# Patient Record
Sex: Male | Born: 1965 | Race: White | Hispanic: No | Smoking: Current every day smoker
Health system: Southern US, Community
[De-identification: ages and names within clinical notes are randomized; demographics above are authoritative.]

## PROBLEM LIST (undated history)

## (undated) DIAGNOSIS — K219 Gastro-esophageal reflux disease without esophagitis: Secondary | ICD-10-CM

## (undated) DIAGNOSIS — G4700 Insomnia, unspecified: Secondary | ICD-10-CM

## (undated) DIAGNOSIS — G8929 Other chronic pain: Secondary | ICD-10-CM

## (undated) DIAGNOSIS — F32A Depression, unspecified: Secondary | ICD-10-CM

## (undated) DIAGNOSIS — G5601 Carpal tunnel syndrome, right upper limb: Secondary | ICD-10-CM

## (undated) DIAGNOSIS — F329 Major depressive disorder, single episode, unspecified: Secondary | ICD-10-CM

## (undated) DIAGNOSIS — K5792 Diverticulitis of intestine, part unspecified, without perforation or abscess without bleeding: Secondary | ICD-10-CM

## (undated) DIAGNOSIS — G5602 Carpal tunnel syndrome, left upper limb: Secondary | ICD-10-CM

## (undated) DIAGNOSIS — R0683 Snoring: Secondary | ICD-10-CM

## (undated) HISTORY — PX: KNEE ARTHROSCOPY: SUR90

## (undated) HISTORY — PX: COLONOSCOPY: SHX174

## (undated) HISTORY — PX: CERVICAL FUSION: SHX112

---

## 1990-10-07 HISTORY — PX: HERNIA REPAIR: SHX51

## 1999-02-12 ENCOUNTER — Other Ambulatory Visit: Admission: RE | Admit: 1999-02-12 | Discharge: 1999-02-12 | Payer: Self-pay | Admitting: Family Medicine

## 2003-10-08 HISTORY — PX: PARTIAL COLECTOMY: SHX5273

## 2004-10-09 ENCOUNTER — Ambulatory Visit: Payer: Self-pay | Admitting: Family Medicine

## 2004-10-15 ENCOUNTER — Ambulatory Visit: Payer: Self-pay | Admitting: Family Medicine

## 2004-12-24 ENCOUNTER — Ambulatory Visit: Payer: Self-pay | Admitting: Family Medicine

## 2005-01-03 ENCOUNTER — Ambulatory Visit (HOSPITAL_COMMUNITY): Admission: RE | Admit: 2005-01-03 | Discharge: 2005-01-04 | Payer: Self-pay | Admitting: Neurosurgery

## 2005-01-25 ENCOUNTER — Encounter: Admission: RE | Admit: 2005-01-25 | Discharge: 2005-01-25 | Payer: Self-pay | Admitting: Neurosurgery

## 2005-02-05 ENCOUNTER — Emergency Department (HOSPITAL_COMMUNITY): Admission: EM | Admit: 2005-02-05 | Discharge: 2005-02-05 | Payer: Self-pay | Admitting: Emergency Medicine

## 2005-02-27 ENCOUNTER — Ambulatory Visit: Payer: Self-pay | Admitting: Family Medicine

## 2005-03-06 ENCOUNTER — Ambulatory Visit: Payer: Self-pay | Admitting: Family Medicine

## 2005-04-22 ENCOUNTER — Ambulatory Visit: Payer: Self-pay | Admitting: Family Medicine

## 2005-05-27 ENCOUNTER — Ambulatory Visit: Payer: Self-pay | Admitting: Family Medicine

## 2005-10-16 ENCOUNTER — Ambulatory Visit: Payer: Self-pay | Admitting: Family Medicine

## 2005-10-18 ENCOUNTER — Ambulatory Visit: Payer: Self-pay | Admitting: Family Medicine

## 2005-12-18 ENCOUNTER — Ambulatory Visit: Payer: Self-pay | Admitting: Family Medicine

## 2011-01-09 ENCOUNTER — Other Ambulatory Visit: Payer: Self-pay | Admitting: Emergency Medicine

## 2011-01-09 ENCOUNTER — Ambulatory Visit
Admission: RE | Admit: 2011-01-09 | Discharge: 2011-01-09 | Disposition: A | Discharge status: Home / Self Care | Payer: Self-pay | Source: Ambulatory Visit

## 2011-01-09 ENCOUNTER — Ambulatory Visit
Admission: RE | Admit: 2011-01-09 | Discharge: 2011-01-09 | Disposition: A | Discharge status: Home / Self Care | Payer: Self-pay | Source: Ambulatory Visit | Attending: Emergency Medicine | Admitting: Emergency Medicine

## 2011-01-09 DIAGNOSIS — S199XXA Unspecified injury of neck, initial encounter: Secondary | ICD-10-CM

## 2011-01-09 DIAGNOSIS — M542 Cervicalgia: Secondary | ICD-10-CM

## 2011-03-29 ENCOUNTER — Ambulatory Visit
Admission: RE | Admit: 2011-03-29 | Discharge: 2011-03-29 | Disposition: A | Discharge status: Home / Self Care | Payer: BC Managed Care – PPO | Source: Ambulatory Visit | Attending: Neurosurgery | Admitting: Neurosurgery

## 2011-03-29 ENCOUNTER — Other Ambulatory Visit: Payer: Self-pay | Admitting: Neurosurgery

## 2011-03-29 DIAGNOSIS — M542 Cervicalgia: Secondary | ICD-10-CM

## 2011-07-08 ENCOUNTER — Other Ambulatory Visit: Payer: Self-pay | Admitting: Neurosurgery

## 2011-07-08 DIAGNOSIS — M542 Cervicalgia: Secondary | ICD-10-CM

## 2011-07-10 ENCOUNTER — Ambulatory Visit
Admission: RE | Admit: 2011-07-10 | Discharge: 2011-07-10 | Disposition: A | Discharge status: Home / Self Care | Payer: PRIVATE HEALTH INSURANCE | Source: Ambulatory Visit | Attending: Neurosurgery | Admitting: Neurosurgery

## 2011-07-10 DIAGNOSIS — M542 Cervicalgia: Secondary | ICD-10-CM

## 2011-07-10 MED ORDER — MEPERIDINE HCL 100 MG/ML IJ SOLN
75.0000 mg | Freq: Once | INTRAMUSCULAR | Status: AC
  Administered 2011-07-10: 75 mg via INTRAMUSCULAR

## 2011-07-10 MED ORDER — DIAZEPAM 5 MG PO TABS
10.0000 mg | ORAL_TABLET | Freq: Once | ORAL | Status: AC
  Administered 2011-07-10: 10 mg via ORAL

## 2011-07-10 MED ORDER — ONDANSETRON HCL 4 MG/2ML IJ SOLN
4.0000 mg | Freq: Once | INTRAMUSCULAR | Status: AC
  Administered 2011-07-10: 4 mg via INTRAMUSCULAR

## 2011-07-10 MED ORDER — IOHEXOL 300 MG/ML  SOLN
10.0000 mL | Freq: Once | INTRAMUSCULAR | Status: AC | PRN
  Administered 2011-07-10: 10 mL via INTRATHECAL

## 2011-07-10 NOTE — Discharge Instructions (Signed)

## 2012-08-26 IMAGING — RF DG MYELOGRAM CERVICAL
11 series · 11 of 11 positions shown · IV contrast (omnipaque)
Comparison: none

CLINICAL DATA: Central neck pain radiating to both shoulders.

MYELOGRAM CERVICAL
TECHNIQUE: The procedure, risks, benefits, and alternatives were
explained to the patient. The patient understands and consents.
Under fluoroscopic guidance, a 22 gauge spinal needle was placed in
the CSF space via right L2-3 approach. 10 mL of Omnipaque 300 was
injected.

[Series 1: (hospital) · 1 of 1 slices shown (1 of 2)]
[im 1/1]
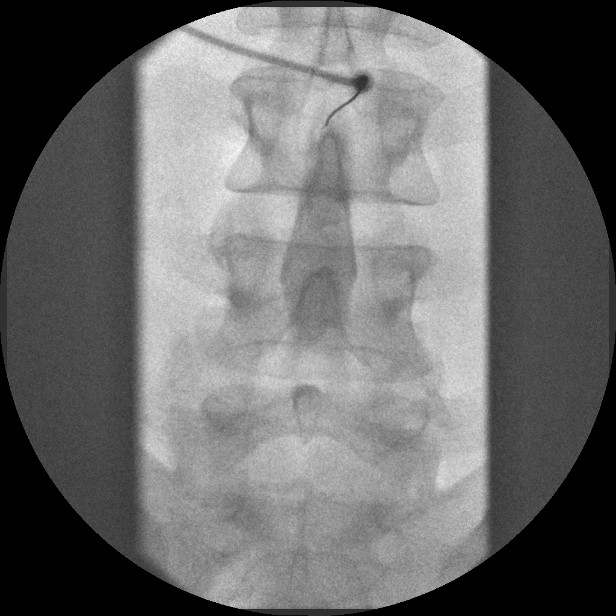

[Series 2: myelogram  white · 1 of 1 slices shown (1 of 9)]
[im 1/1]
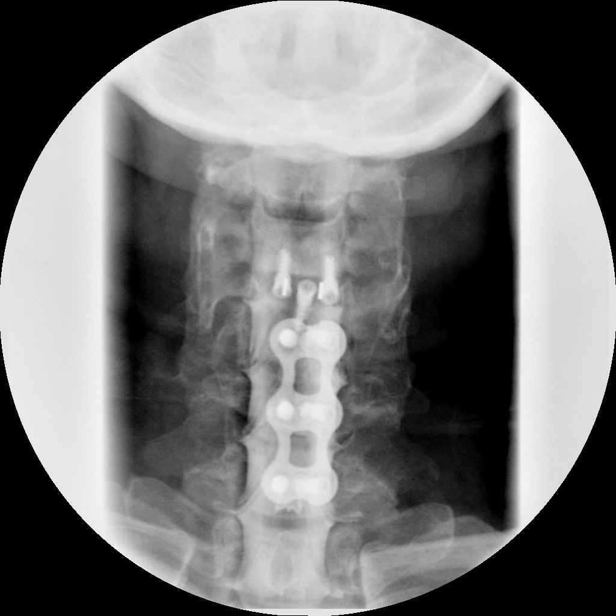

[Series 3: myelogram  white · 1 of 1 slices shown (2 of 9)]
[im 1/1]
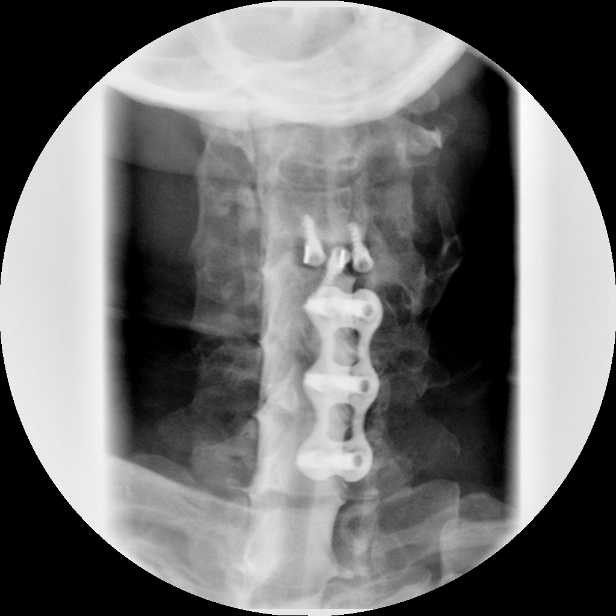

[Series 4: myelogram  white · 1 of 1 slices shown (3 of 9)]
[im 1/1]
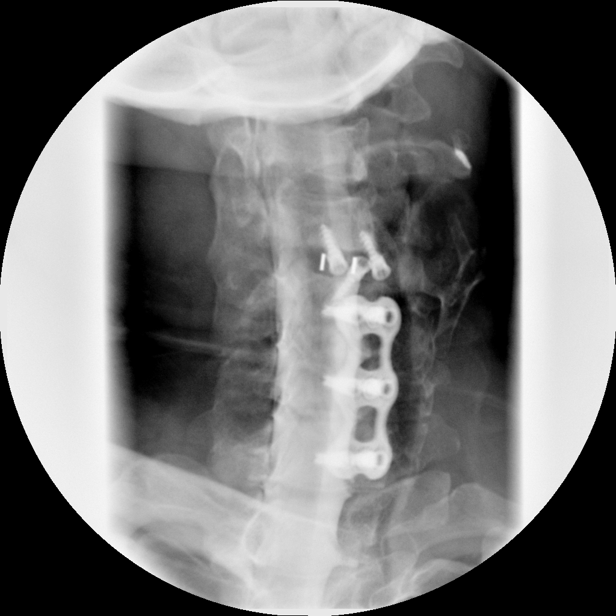

[Series 5: myelogram  white · 1 of 1 slices shown (4 of 9)]
[im 1/1]
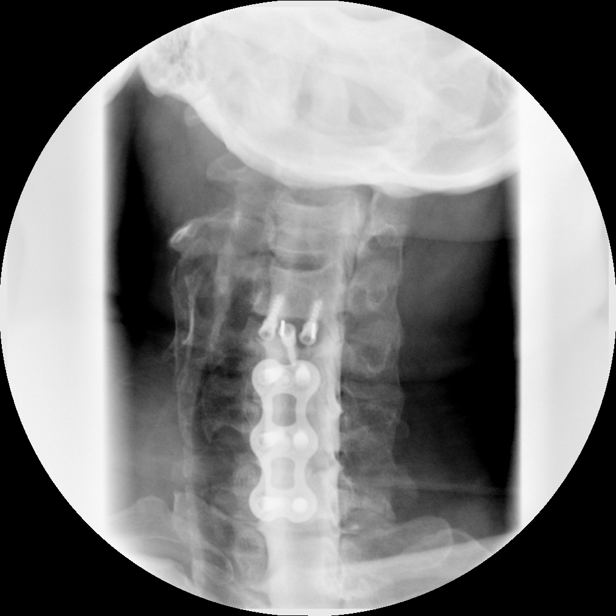

[Series 6: myelogram  white · 1 of 1 slices shown (5 of 9)]
[im 1/1]
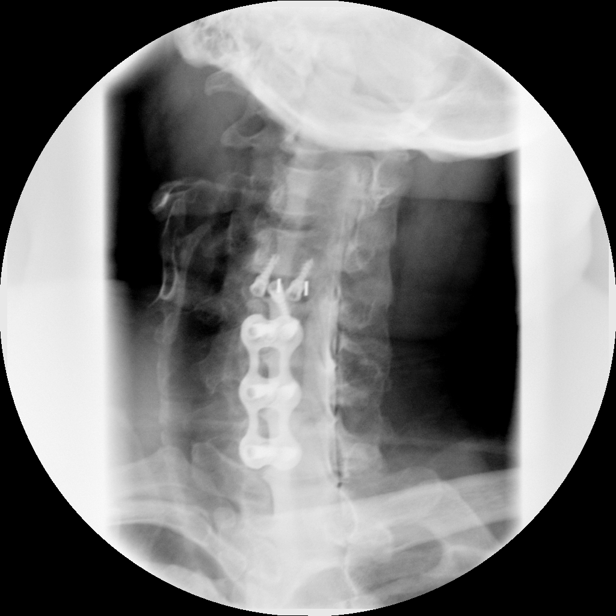

[Series 7: myelogram  white · 1 of 1 slices shown (6 of 9)]
[im 1/1]
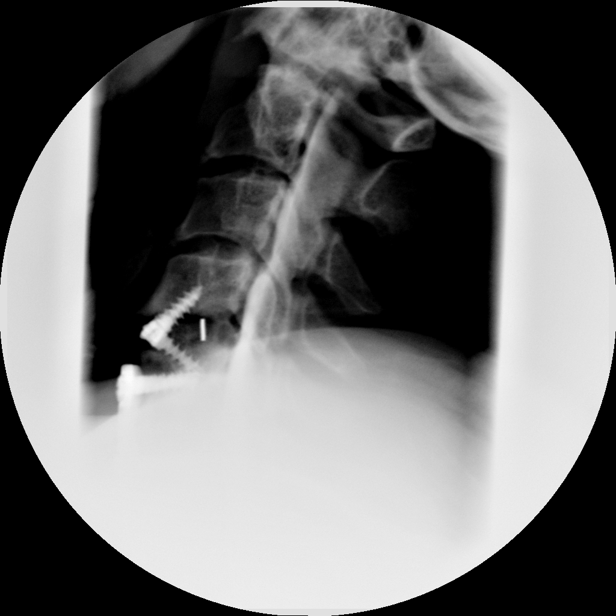

[Series 8: myelogram  white · 1 of 1 slices shown (7 of 9)]
[im 1/1]
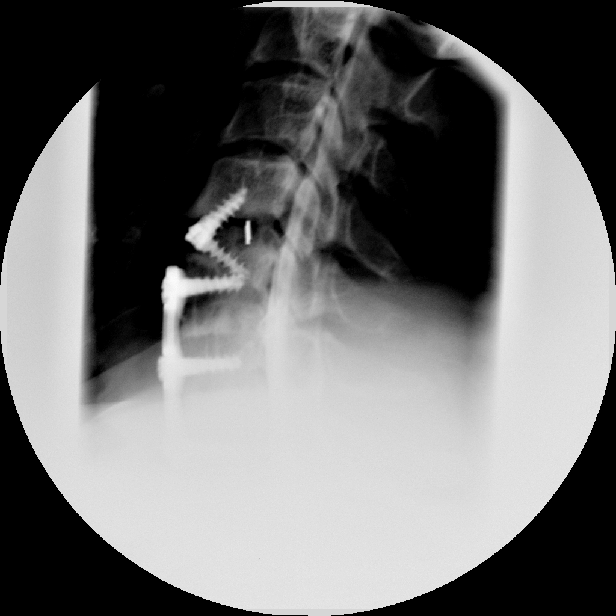

[Series 9: myelogram  white · 1 of 1 slices shown (8 of 9)]
[im 1/1]
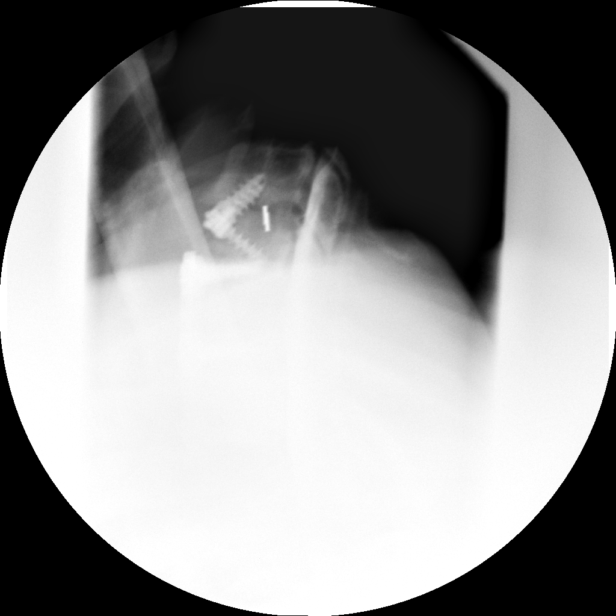

[Series 10: (hospital) · 1 of 1 slices shown (2 of 2)]
[im 1/1]
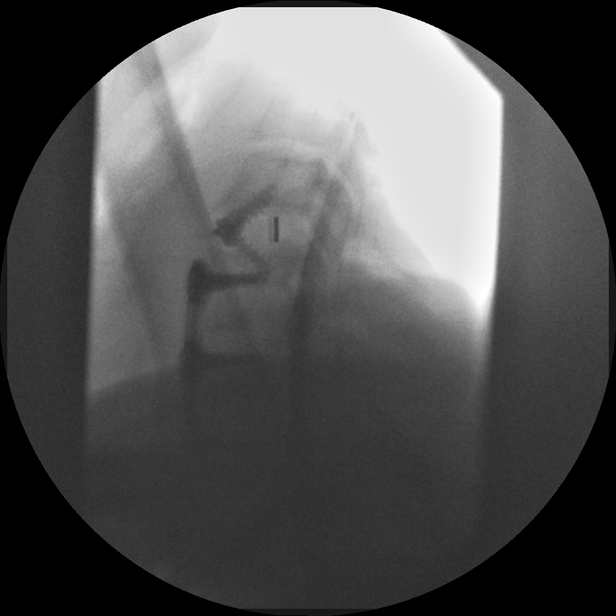

[Series 11: myelogram  white · 1 of 1 slices shown (9 of 9)]
[im 1/1]
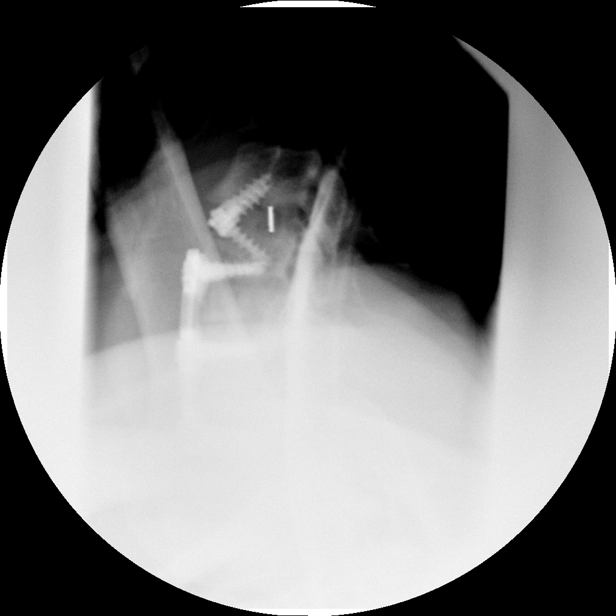

[11 of 11 positions shown; findings below may reference images not displayed]

FINDINGS: Anterior fusion hardware is present from C4-C7.
Interposed tubular bone plugs and C5-6 and C6-7.  Disc spacer at C4-
5.  No breakage or loosening of the hardware.  Anatomic alignment.
No obvious central stenosis or anterior epidural mass effect.  No
evidence of nerve root sleeve effacement.

Complications: None

Fluoroscopy Time: 1 minute and 26 seconds.
IMPRESSION: C4-C7 anterior fusion without apparent  complication.  No spinal
stenosis or evidence of impingement.

## 2013-09-07 ENCOUNTER — Encounter (HOSPITAL_BASED_OUTPATIENT_CLINIC_OR_DEPARTMENT_OTHER): Payer: Self-pay | Admitting: *Deleted

## 2013-09-07 NOTE — Progress Notes (Signed)
Pt chronic pain arms and knees-no cardiac problems-does smoke

## 2013-09-09 ENCOUNTER — Other Ambulatory Visit: Payer: Self-pay | Admitting: Orthopedic Surgery

## 2013-09-10 ENCOUNTER — Ambulatory Visit (HOSPITAL_BASED_OUTPATIENT_CLINIC_OR_DEPARTMENT_OTHER): Payer: Commercial Managed Care - PPO | Admitting: Certified Registered"

## 2013-09-10 ENCOUNTER — Encounter (HOSPITAL_BASED_OUTPATIENT_CLINIC_OR_DEPARTMENT_OTHER)
Admission: RE | Disposition: A | Discharge status: Home / Self Care | Payer: Self-pay | Source: Ambulatory Visit | Attending: Orthopedic Surgery

## 2013-09-10 ENCOUNTER — Encounter (HOSPITAL_BASED_OUTPATIENT_CLINIC_OR_DEPARTMENT_OTHER): Payer: Commercial Managed Care - PPO | Admitting: Certified Registered"

## 2013-09-10 ENCOUNTER — Encounter (HOSPITAL_BASED_OUTPATIENT_CLINIC_OR_DEPARTMENT_OTHER): Payer: Self-pay | Admitting: *Deleted

## 2013-09-10 ENCOUNTER — Ambulatory Visit (HOSPITAL_BASED_OUTPATIENT_CLINIC_OR_DEPARTMENT_OTHER)
Admission: RE | Admit: 2013-09-10 | Discharge: 2013-09-10 | Disposition: A | Discharge status: Home / Self Care | Payer: Commercial Managed Care - PPO | Source: Ambulatory Visit | Attending: Orthopedic Surgery | Admitting: Orthopedic Surgery

## 2013-09-10 DIAGNOSIS — G5600 Carpal tunnel syndrome, unspecified upper limb: Secondary | ICD-10-CM | POA: Insufficient documentation

## 2013-09-10 DIAGNOSIS — F3289 Other specified depressive episodes: Secondary | ICD-10-CM | POA: Insufficient documentation

## 2013-09-10 DIAGNOSIS — Z79899 Other long term (current) drug therapy: Secondary | ICD-10-CM | POA: Insufficient documentation

## 2013-09-10 DIAGNOSIS — F17200 Nicotine dependence, unspecified, uncomplicated: Secondary | ICD-10-CM | POA: Insufficient documentation

## 2013-09-10 DIAGNOSIS — K219 Gastro-esophageal reflux disease without esophagitis: Secondary | ICD-10-CM | POA: Insufficient documentation

## 2013-09-10 DIAGNOSIS — G5601 Carpal tunnel syndrome, right upper limb: Secondary | ICD-10-CM

## 2013-09-10 DIAGNOSIS — K5732 Diverticulitis of large intestine without perforation or abscess without bleeding: Secondary | ICD-10-CM | POA: Insufficient documentation

## 2013-09-10 DIAGNOSIS — G4700 Insomnia, unspecified: Secondary | ICD-10-CM | POA: Insufficient documentation

## 2013-09-10 DIAGNOSIS — F329 Major depressive disorder, single episode, unspecified: Secondary | ICD-10-CM | POA: Insufficient documentation

## 2013-09-10 DIAGNOSIS — G8929 Other chronic pain: Secondary | ICD-10-CM | POA: Insufficient documentation

## 2013-09-10 HISTORY — DX: Insomnia, unspecified: G47.00

## 2013-09-10 HISTORY — DX: Carpal tunnel syndrome, right upper limb: G56.01

## 2013-09-10 HISTORY — DX: Other chronic pain: G89.29

## 2013-09-10 HISTORY — DX: Depression, unspecified: F32.A

## 2013-09-10 HISTORY — DX: Snoring: R06.83

## 2013-09-10 HISTORY — DX: Gastro-esophageal reflux disease without esophagitis: K21.9

## 2013-09-10 HISTORY — DX: Diverticulitis of intestine, part unspecified, without perforation or abscess without bleeding: K57.92

## 2013-09-10 HISTORY — PX: CARPAL TUNNEL RELEASE: SHX101

## 2013-09-10 HISTORY — DX: Major depressive disorder, single episode, unspecified: F32.9

## 2013-09-10 SURGERY — CARPAL TUNNEL RELEASE
Anesthesia: General | Site: Wrist | Laterality: Right

## 2013-09-10 MED ORDER — LIDOCAINE HCL (CARDIAC) 20 MG/ML IV SOLN
INTRAVENOUS | Status: DC | PRN
  Administered 2013-09-10: 90 mg via INTRAVENOUS

## 2013-09-10 MED ORDER — HYDROMORPHONE HCL PF 1 MG/ML IJ SOLN: 0.2500 mg | INTRAMUSCULAR | Status: DC | PRN

## 2013-09-10 MED ORDER — FENTANYL CITRATE 0.05 MG/ML IJ SOLN
INTRAMUSCULAR | Status: AC
  Filled 2013-09-10: qty 4

## 2013-09-10 MED ORDER — CEFAZOLIN SODIUM-DEXTROSE 2-3 GM-% IV SOLR
2.0000 g | INTRAVENOUS | Status: AC
  Administered 2013-09-10: 2 g via INTRAVENOUS

## 2013-09-10 MED ORDER — PROMETHAZINE HCL 25 MG PO TABS: 25.0000 mg | ORAL_TABLET | Freq: Four times a day (QID) | ORAL | Status: DC | PRN

## 2013-09-10 MED ORDER — MIDAZOLAM HCL 2 MG/2ML IJ SOLN: 1.0000 mg | INTRAMUSCULAR | Status: DC | PRN

## 2013-09-10 MED ORDER — MIDAZOLAM HCL 2 MG/2ML IJ SOLN
INTRAMUSCULAR | Status: AC
  Filled 2013-09-10: qty 2

## 2013-09-10 MED ORDER — DEXAMETHASONE SODIUM PHOSPHATE 10 MG/ML IJ SOLN
INTRAMUSCULAR | Status: DC | PRN
  Administered 2013-09-10: 10 mg via INTRAVENOUS

## 2013-09-10 MED ORDER — 0.9 % SODIUM CHLORIDE (POUR BTL) OPTIME
TOPICAL | Status: DC | PRN
  Administered 2013-09-10: 200 mL

## 2013-09-10 MED ORDER — OXYCODONE HCL 5 MG/5ML PO SOLN: 5.0000 mg | Freq: Once | ORAL | Status: DC | PRN

## 2013-09-10 MED ORDER — LACTATED RINGERS IV SOLN
INTRAVENOUS | Status: DC
Start: 2013-09-10 — End: 2013-09-10
  Administered 2013-09-10: 07:00:00 via INTRAVENOUS

## 2013-09-10 MED ORDER — MIDAZOLAM HCL 5 MG/5ML IJ SOLN
INTRAMUSCULAR | Status: DC | PRN
  Administered 2013-09-10: 2 mg via INTRAVENOUS

## 2013-09-10 MED ORDER — PROPOFOL 10 MG/ML IV EMUL
INTRAVENOUS | Status: AC
  Filled 2013-09-10: qty 50

## 2013-09-10 MED ORDER — BUPIVACAINE HCL (PF) 0.25 % IJ SOLN
INTRAMUSCULAR | Status: AC
  Filled 2013-09-10: qty 30

## 2013-09-10 MED ORDER — PROPOFOL 10 MG/ML IV BOLUS
INTRAVENOUS | Status: DC | PRN
  Administered 2013-09-10: 200 mg via INTRAVENOUS

## 2013-09-10 MED ORDER — OXYCODONE-ACETAMINOPHEN 10-325 MG PO TABS: 1.0000 | ORAL_TABLET | Freq: Four times a day (QID) | ORAL | Status: DC | PRN

## 2013-09-10 MED ORDER — FENTANYL CITRATE 0.05 MG/ML IJ SOLN
INTRAMUSCULAR | Status: DC | PRN
  Administered 2013-09-10: 100 ug via INTRAVENOUS

## 2013-09-10 MED ORDER — ONDANSETRON HCL 4 MG/2ML IJ SOLN: 4.0000 mg | Freq: Once | INTRAMUSCULAR | Status: DC | PRN

## 2013-09-10 MED ORDER — OXYCODONE HCL 5 MG PO TABS: 5.0000 mg | ORAL_TABLET | Freq: Once | ORAL | Status: DC | PRN

## 2013-09-10 MED ORDER — FENTANYL CITRATE 0.05 MG/ML IJ SOLN: 50.0000 ug | INTRAMUSCULAR | Status: DC | PRN

## 2013-09-10 MED ORDER — BUPIVACAINE HCL (PF) 0.5 % IJ SOLN
INTRAMUSCULAR | Status: DC | PRN
  Administered 2013-09-10: 10 mL

## 2013-09-10 MED ORDER — CEFAZOLIN SODIUM 1-5 GM-% IV SOLN
INTRAVENOUS | Status: AC
  Filled 2013-09-10: qty 100

## 2013-09-10 MED ORDER — HYDROMORPHONE HCL 2 MG PO TABS: 2.0000 mg | ORAL_TABLET | ORAL | Status: DC | PRN

## 2013-09-10 MED ORDER — ONDANSETRON HCL 4 MG/2ML IJ SOLN
INTRAMUSCULAR | Status: DC | PRN
  Administered 2013-09-10: 4 mg via INTRAVENOUS

## 2013-09-10 SURGICAL SUPPLY — 45 items
BANDAGE ELASTIC 3 VELCRO ST LF (GAUZE/BANDAGES/DRESSINGS) ×2 IMPLANT
BLADE SURG 15 STRL LF DISP TIS (BLADE) ×1 IMPLANT
BLADE SURG 15 STRL SS (BLADE) ×2
BNDG CMPR 9X4 STRL LF SNTH (GAUZE/BANDAGES/DRESSINGS) ×1
BNDG ESMARK 4X9 LF (GAUZE/BANDAGES/DRESSINGS) ×1 IMPLANT
CORDS BIPOLAR (ELECTRODE) ×2 IMPLANT
COVER TABLE BACK 60X90 (DRAPES) ×2 IMPLANT
CUFF TOURNIQUET SINGLE 18IN (TOURNIQUET CUFF) ×1 IMPLANT
DRAPE EXTREMITY T 121X128X90 (DRAPE) ×2 IMPLANT
DRAPE SURG 17X23 STRL (DRAPES) ×2 IMPLANT
DRAPE U 20/CS (DRAPES) ×2 IMPLANT
DURAPREP 26ML APPLICATOR (WOUND CARE) ×2 IMPLANT
GAUZE XEROFORM 1X8 LF (GAUZE/BANDAGES/DRESSINGS) ×2 IMPLANT
GLOVE BIO SURGEON STRL SZ8 (GLOVE) ×2 IMPLANT
GLOVE BIOGEL PI IND STRL 6.5 (GLOVE) IMPLANT
GLOVE BIOGEL PI IND STRL 7.0 (GLOVE) IMPLANT
GLOVE BIOGEL PI IND STRL 8 (GLOVE) ×2 IMPLANT
GLOVE BIOGEL PI INDICATOR 6.5 (GLOVE) ×1
GLOVE BIOGEL PI INDICATOR 7.0 (GLOVE) ×1
GLOVE BIOGEL PI INDICATOR 8 (GLOVE) ×2
GLOVE EXAM NITRILE MD LF STRL (GLOVE) ×1 IMPLANT
GLOVE ORTHO TXT STRL SZ7.5 (GLOVE) ×2 IMPLANT
GOWN BRE IMP PREV XXLGXLNG (GOWN DISPOSABLE) ×4 IMPLANT
GOWN PREVENTION PLUS XLARGE (GOWN DISPOSABLE) ×2 IMPLANT
NDL HYPO 25X1 1.5 SAFETY (NEEDLE) IMPLANT
NEEDLE HYPO 25X1 1.5 SAFETY (NEEDLE) ×2 IMPLANT
NS IRRIG 1000ML POUR BTL (IV SOLUTION) ×2 IMPLANT
PACK BASIN DAY SURGERY FS (CUSTOM PROCEDURE TRAY) ×2 IMPLANT
PAD CAST 3X4 CTTN HI CHSV (CAST SUPPLIES) ×1 IMPLANT
PADDING CAST ABS 3INX4YD NS (CAST SUPPLIES) ×1
PADDING CAST ABS 4INX4YD NS (CAST SUPPLIES) ×1
PADDING CAST ABS COTTON 3X4 (CAST SUPPLIES) ×1 IMPLANT
PADDING CAST ABS COTTON 4X4 ST (CAST SUPPLIES) ×1 IMPLANT
PADDING CAST COTTON 3X4 STRL (CAST SUPPLIES) ×2
SPLINT PLASTER CAST XFAST 3X15 (CAST SUPPLIES) IMPLANT
SPLINT PLASTER CAST XFAST 4X15 (CAST SUPPLIES) IMPLANT
SPLINT PLASTER XTRA FAST SET 4 (CAST SUPPLIES) ×7
SPLINT PLASTER XTRA FASTSET 3X (CAST SUPPLIES)
SPONGE GAUZE 4X4 12PLY (GAUZE/BANDAGES/DRESSINGS) ×2 IMPLANT
STOCKINETTE 4X48 STRL (DRAPES) ×2 IMPLANT
SUT ETHILON 4 0 PS 2 18 (SUTURE) ×2 IMPLANT
SYR BULB 3OZ (MISCELLANEOUS) ×2 IMPLANT
SYR CONTROL 10ML LL (SYRINGE) ×1 IMPLANT
TOWEL OR 17X24 6PK STRL BLUE (TOWEL DISPOSABLE) ×2 IMPLANT
UNDERPAD 30X30 INCONTINENT (UNDERPADS AND DIAPERS) ×2 IMPLANT

## 2013-09-10 NOTE — Anesthesia Procedure Notes (Signed)
Procedure Name: LMA Insertion Performed by: Aleksa Catterton, [place] Pre-anesthesia Checklist: Patient identified, Emergency Drugs available, Suction available and Patient being monitored Patient Re-evaluated:Patient Re-evaluated prior to inductionOxygen Delivery Method: Circle System Utilized Preoxygenation: Pre-oxygenation with 100% oxygen Intubation Type: IV induction Ventilation: Mask ventilation without difficulty LMA: LMA inserted LMA Size: 4.0 Number of attempts: 1 Airway Equipment and Method: bite block Placement Confirmation: positive ETCO2 Tube secured with: Tape Dental Injury: Teeth and Oropharynx as per pre-operative assessment      

## 2013-09-10 NOTE — Anesthesia Postprocedure Evaluation (Signed)
  Anesthesia Post-op Note  Patient: Frank Sharp  Procedure(s) Performed: Procedure(s): RIGHT WRIST CARPAL TUNNEL RELEASE (Right)  Patient Location: PACU  Anesthesia Type:General  Level of Consciousness: awake, alert  and oriented  Airway and Oxygen Therapy: Patient Spontanous Breathing  Post-op Pain: mild  Post-op Assessment: Post-op Vital signs reviewed  Post-op Vital Signs: Reviewed  Complications: No apparent anesthesia complications

## 2013-09-10 NOTE — Op Note (Signed)
09/10/2013  8:07 AM  PATIENT:  Anders Simmonds    PRE-OPERATIVE DIAGNOSIS:  RIGHT WRIST CARPAL TUNNEL SYNDROME   POST-OPERATIVE DIAGNOSIS:  Same  PROCEDURE:  RIGHT WRIST CARPAL TUNNEL RELEASE  SURGEON:  Eulas Post, MD  PHYSICIAN ASSISTANT: none  ANESTHESIA:   General  PREOPERATIVE INDICATIONS:  SHIVEN JUNIOUS is a  47 y.o. male with a diagnosis of RIGHT WRIST CARPAL TUNNEL SYNDROME  who failed conservative measures and elected for surgical management.    The risks benefits and alternatives were discussed with the patient preoperatively including but not limited to the risks of infection, bleeding, nerve injury, incomplete relief of symptoms, pillar pain, cardiopulmonary complications, the need for revision surgery, among others, and the patient was willing to proceed.  OPERATIVE FINDINGS: No significant hemorrhage or atrophy of the median nerve.  OPERATIVE PROCEDURE: The patient was brought to the operating room placed in the supine position. General anesthesia was administered. The upper extremity was prepped and draped in usual sterile fashion. Time out was performed. The arm was elevated and exsanguinated and the tourniquet was inflated. Incision was made in line with the radial border of the ring finger. The carpal tunnel transverse fascia was identified, cleaned, and incised sharply. The common sensory branches were visualized along with the superficial palmar arch and protected.  The median nerve was protected below. Deep retractors were placed underneath the transverse carpal ligament, protecting the nerve. I released the ligament completely, and then released the proximal distal volar forearm fascia. The nerve was identified, and visualized, and protected throughout the case. No masses or abnormalities were identified in ulnar bursa.  The wounds were irrigated copiously, and the wounds injected, and the skin closed with nylon followed by a volar splint and sterile gauze. He  tolerated this well, with no complications.

## 2013-09-10 NOTE — Anesthesia Preprocedure Evaluation (Signed)
Anesthesia Evaluation  Patient identified by MRN, date of birth, ID band Patient awake    Reviewed: Allergy & Precautions, H&P , NPO status , Patient's Chart, lab work & pertinent test results  Airway Mallampati: I TM Distance: >3 FB Neck ROM: Full    Dental  (+) Teeth Intact and Dental Advisory Given   Pulmonary Current Smoker,  breath sounds clear to auscultation        Cardiovascular Rhythm:Regular Rate:Normal     Neuro/Psych    GI/Hepatic GERD-  Medicated and Controlled,  Endo/Other    Renal/GU      Musculoskeletal   Abdominal   Peds  Hematology   Anesthesia Other Findings   Reproductive/Obstetrics                           Anesthesia Physical Anesthesia Plan  ASA: II  Anesthesia Plan: General   Post-op Pain Management:    Induction: Intravenous  Airway Management Planned: LMA  Additional Equipment:   Intra-op Plan:   Post-operative Plan:   Informed Consent: I have reviewed the patients History and Physical, chart, labs and discussed the procedure including the risks, benefits and alternatives for the proposed anesthesia with the patient or authorized representative who has indicated his/her understanding and acceptance.   Dental advisory given  Plan Discussed with: CRNA, Anesthesiologist and Surgeon  Anesthesia Plan Comments:         Anesthesia Quick Evaluation

## 2013-09-10 NOTE — H&P (Signed)
PREOPERATIVE H&P  Chief Complaint: RIGHT WRIST CARPAL TUNNEL SYNDROME   HPI: Frank Sharp is a 47 y.o. male who presents for preoperative history and physical with a diagnosis of RIGHT WRIST CARPAL TUNNEL SYNDROME . Symptoms are rated as moderate to severe, and have been worsening.  This is significantly impairing activities of daily living.  He has elected for surgical management. He has failed carpal tunnel injections, activity modification, bracing, among others.  Past Medical History  Diagnosis Date  . Chronic pain   . Depression   . Insomnia   . Diverticulitis   . GERD (gastroesophageal reflux disease)   . Snores    Past Surgical History  Procedure Laterality Date  . Partial colectomy  2005    diverticulitis  . Cervical fusion  [id number]    x2  . Knee arthroscopy      right and left  . Hernia repair  1992    rt ing  . Colonoscopy     History   Social History  . Marital Status: Legally Separated    Spouse Name: N/A    Number of Children: N/A  . Years of Education: N/A   Social History Main Topics  . Smoking status: Current Every Day Smoker -- 0.50 packs/day  . Smokeless tobacco: None  . Alcohol Use: Yes     Comment: occ  . Drug Use: No  . Sexual Activity: None   Other Topics Concern  . None   Social History Narrative  . None   History reviewed. No pertinent family history. Allergies  Allergen Reactions  . Sulfa Antibiotics Nausea And Vomiting   Prior to Admission medications   Medication Sig Start Date End Date Taking? Authorizing Provider  diazepam (VALIUM) 10 MG tablet Take 10 mg by mouth at bedtime as needed for anxiety.   Yes Historical Provider, MD  omeprazole (PRILOSEC) 20 MG capsule Take 20 mg by mouth daily.   Yes Historical Provider, MD  oxyCODONE-acetaminophen (PERCOCET) 10-325 MG per tablet Take 1 tablet by mouth every 4 (four) hours as needed for pain (takes 4 a day).   Yes Historical Provider, MD  sertraline (ZOLOFT) 100 MG tablet Take  100 mg by mouth daily. Takes a total 150mg /daily   Yes Historical Provider, MD  sertraline (ZOLOFT) 50 MG tablet Take 50 mg by mouth daily.   Yes Historical Provider, MD     Positive ROS: All other systems have been reviewed and were otherwise negative with the exception of those mentioned in the HPI and as above.  Physical Exam: General: Alert, no acute distress Cardiovascular: No pedal edema Respiratory: No cyanosis, no use of accessory musculature GI: No organomegaly, abdomen is soft and non-tender Skin: No lesions in the area of chief complaint Neurologic: Sensation intact distally Psychiatric: Patient is competent for consent with normal mood and affect Lymphatic: No axillary or cervical lymphadenopathy  MUSCULOSKELETAL: Positive right Phalen's test, no thenar atrophy.  Assessment: RIGHT WRIST CARPAL TUNNEL SYNDROME   Plan: Plan for Procedure(s): RIGHT WRIST CARPAL TUNNEL RELEASE  The risks benefits and alternatives were discussed with the patient including but not limited to the risks of nonoperative treatment, versus surgical intervention including infection, bleeding, nerve injury,  blood clots, cardiopulmonary complications, morbidity, mortality, among others, and they were willing to proceed. We also discussed the risks for ongoing neurologic damage, incomplete relief of symptoms, among others.  Eulas Post, MD Cell [phone]   09/10/2013 7:18 AM

## 2013-09-10 NOTE — Transfer of Care (Signed)
Immediate Anesthesia Transfer of Care Note  Patient: Frank Sharp  Procedure(s) Performed: Procedure(s): RIGHT WRIST CARPAL TUNNEL RELEASE (Right)  Patient Location: PACU  Anesthesia Type:General  Level of Consciousness: sedated  Airway & Oxygen Therapy: Patient Spontanous Breathing and Patient connected to face mask oxygen  Post-op Assessment: Report given to PACU RN and Post -op Vital signs reviewed and stable  Post vital signs: Reviewed and stable  Complications: No apparent anesthesia complications

## 2013-09-13 ENCOUNTER — Encounter (HOSPITAL_BASED_OUTPATIENT_CLINIC_OR_DEPARTMENT_OTHER): Payer: Self-pay | Admitting: Orthopedic Surgery

## 2013-11-24 ENCOUNTER — Encounter (HOSPITAL_BASED_OUTPATIENT_CLINIC_OR_DEPARTMENT_OTHER): Payer: Self-pay | Admitting: *Deleted

## 2013-11-24 ENCOUNTER — Other Ambulatory Visit: Payer: Self-pay | Admitting: Orthopedic Surgery

## 2013-11-24 NOTE — Progress Notes (Signed)
Pt here 12/14 for rt ctr-did well-no labs needed

## 2013-11-26 ENCOUNTER — Ambulatory Visit (HOSPITAL_BASED_OUTPATIENT_CLINIC_OR_DEPARTMENT_OTHER)
Admission: RE | Admit: 2013-11-26 | Discharge: 2013-11-26 | Disposition: A | Discharge status: Home / Self Care | Payer: Commercial Managed Care - PPO | Source: Ambulatory Visit | Attending: Orthopedic Surgery | Admitting: Orthopedic Surgery

## 2013-11-26 ENCOUNTER — Encounter (HOSPITAL_BASED_OUTPATIENT_CLINIC_OR_DEPARTMENT_OTHER): Payer: Commercial Managed Care - PPO | Admitting: Anesthesiology

## 2013-11-26 ENCOUNTER — Ambulatory Visit (HOSPITAL_BASED_OUTPATIENT_CLINIC_OR_DEPARTMENT_OTHER): Payer: Commercial Managed Care - PPO | Admitting: Anesthesiology

## 2013-11-26 ENCOUNTER — Encounter (HOSPITAL_BASED_OUTPATIENT_CLINIC_OR_DEPARTMENT_OTHER): Payer: Self-pay | Admitting: *Deleted

## 2013-11-26 ENCOUNTER — Encounter (HOSPITAL_BASED_OUTPATIENT_CLINIC_OR_DEPARTMENT_OTHER)
Admission: RE | Disposition: A | Discharge status: Home / Self Care | Payer: Self-pay | Source: Ambulatory Visit | Attending: Orthopedic Surgery

## 2013-11-26 DIAGNOSIS — G8929 Other chronic pain: Secondary | ICD-10-CM | POA: Insufficient documentation

## 2013-11-26 DIAGNOSIS — K219 Gastro-esophageal reflux disease without esophagitis: Secondary | ICD-10-CM | POA: Insufficient documentation

## 2013-11-26 DIAGNOSIS — F329 Major depressive disorder, single episode, unspecified: Secondary | ICD-10-CM | POA: Insufficient documentation

## 2013-11-26 DIAGNOSIS — G5600 Carpal tunnel syndrome, unspecified upper limb: Secondary | ICD-10-CM | POA: Insufficient documentation

## 2013-11-26 DIAGNOSIS — F3289 Other specified depressive episodes: Secondary | ICD-10-CM | POA: Insufficient documentation

## 2013-11-26 DIAGNOSIS — F17200 Nicotine dependence, unspecified, uncomplicated: Secondary | ICD-10-CM | POA: Insufficient documentation

## 2013-11-26 DIAGNOSIS — G5602 Carpal tunnel syndrome, left upper limb: Secondary | ICD-10-CM | POA: Diagnosis present

## 2013-11-26 DIAGNOSIS — G4700 Insomnia, unspecified: Secondary | ICD-10-CM | POA: Insufficient documentation

## 2013-11-26 HISTORY — DX: Carpal tunnel syndrome, left upper limb: G56.02

## 2013-11-26 HISTORY — PX: CARPAL TUNNEL RELEASE: SHX101

## 2013-11-26 SURGERY — CARPAL TUNNEL RELEASE
Anesthesia: General | Site: Wrist | Laterality: Left

## 2013-11-26 MED ORDER — LACTATED RINGERS IV SOLN
INTRAVENOUS | Status: DC
Start: 2013-11-26 — End: 2013-11-26
  Administered 2013-11-26: 07:00:00 via INTRAVENOUS

## 2013-11-26 MED ORDER — GLYCOPYRROLATE 0.2 MG/ML IJ SOLN
INTRAMUSCULAR | Status: DC | PRN
  Administered 2013-11-26: 0.2 mg via INTRAVENOUS

## 2013-11-26 MED ORDER — MIDAZOLAM HCL 2 MG/2ML IJ SOLN: 1.0000 mg | INTRAMUSCULAR | Status: DC | PRN

## 2013-11-26 MED ORDER — ONDANSETRON HCL 4 MG/2ML IJ SOLN: 4.0000 mg | Freq: Once | INTRAMUSCULAR | Status: DC | PRN

## 2013-11-26 MED ORDER — FENTANYL CITRATE 0.05 MG/ML IJ SOLN: 50.0000 ug | INTRAMUSCULAR | Status: DC | PRN

## 2013-11-26 MED ORDER — MIDAZOLAM HCL 5 MG/5ML IJ SOLN
INTRAMUSCULAR | Status: DC | PRN
  Administered 2013-11-26: 2 mg via INTRAVENOUS

## 2013-11-26 MED ORDER — BUPIVACAINE HCL (PF) 0.5 % IJ SOLN
INTRAMUSCULAR | Status: AC
  Filled 2013-11-26: qty 30

## 2013-11-26 MED ORDER — CEFAZOLIN SODIUM-DEXTROSE 2-3 GM-% IV SOLR
INTRAVENOUS | Status: AC
  Filled 2013-11-26: qty 50

## 2013-11-26 MED ORDER — HYDROMORPHONE HCL PF 1 MG/ML IJ SOLN
0.2500 mg | INTRAMUSCULAR | Status: DC | PRN
  Administered 2013-11-26 (×4): 0.5 mg via INTRAVENOUS

## 2013-11-26 MED ORDER — LIDOCAINE HCL (CARDIAC) 20 MG/ML IV SOLN
INTRAVENOUS | Status: DC | PRN
  Administered 2013-11-26: 40 mg via INTRAVENOUS

## 2013-11-26 MED ORDER — 0.9 % SODIUM CHLORIDE (POUR BTL) OPTIME
TOPICAL | Status: DC | PRN
  Administered 2013-11-26: 200 mL

## 2013-11-26 MED ORDER — DEXAMETHASONE SODIUM PHOSPHATE 4 MG/ML IJ SOLN
INTRAMUSCULAR | Status: DC | PRN
  Administered 2013-11-26: 10 mg via INTRAVENOUS

## 2013-11-26 MED ORDER — BUPIVACAINE HCL (PF) 0.25 % IJ SOLN
INTRAMUSCULAR | Status: AC
  Filled 2013-11-26: qty 30

## 2013-11-26 MED ORDER — MIDAZOLAM HCL 2 MG/2ML IJ SOLN
INTRAMUSCULAR | Status: AC
  Filled 2013-11-26: qty 2

## 2013-11-26 MED ORDER — CEFAZOLIN SODIUM-DEXTROSE 2-3 GM-% IV SOLR
INTRAVENOUS | Status: DC | PRN
  Administered 2013-11-26: 2 g via INTRAVENOUS

## 2013-11-26 MED ORDER — ONDANSETRON HCL 4 MG/2ML IJ SOLN
INTRAMUSCULAR | Status: DC | PRN
  Administered 2013-11-26: 4 mg via INTRAVENOUS

## 2013-11-26 MED ORDER — OXYCODONE HCL 5 MG PO TABS
ORAL_TABLET | ORAL | Status: AC
  Filled 2013-11-26: qty 1

## 2013-11-26 MED ORDER — BUPIVACAINE HCL (PF) 0.5 % IJ SOLN
INTRAMUSCULAR | Status: DC | PRN
  Administered 2013-11-26: 10 mL

## 2013-11-26 MED ORDER — CEFAZOLIN SODIUM-DEXTROSE 2-3 GM-% IV SOLR: 2.0000 g | INTRAVENOUS | Status: DC

## 2013-11-26 MED ORDER — PROMETHAZINE HCL 25 MG PO TABS: 25.0000 mg | ORAL_TABLET | Freq: Four times a day (QID) | ORAL | Status: AC | PRN

## 2013-11-26 MED ORDER — OXYCODONE HCL 5 MG/5ML PO SOLN: 5.0000 mg | Freq: Once | ORAL | Status: AC | PRN

## 2013-11-26 MED ORDER — HYDROMORPHONE HCL PF 1 MG/ML IJ SOLN
INTRAMUSCULAR | Status: AC
  Filled 2013-11-26: qty 1

## 2013-11-26 MED ORDER — OXYCODONE-ACETAMINOPHEN 10-325 MG PO TABS: 1.0000 | ORAL_TABLET | Freq: Four times a day (QID) | ORAL | Status: AC | PRN

## 2013-11-26 MED ORDER — FENTANYL CITRATE 0.05 MG/ML IJ SOLN
INTRAMUSCULAR | Status: DC | PRN
  Administered 2013-11-26: 100 ug via INTRAVENOUS

## 2013-11-26 MED ORDER — OXYCODONE HCL 5 MG PO TABS
5.0000 mg | ORAL_TABLET | Freq: Once | ORAL | Status: AC | PRN
  Administered 2013-11-26: 5 mg via ORAL

## 2013-11-26 MED ORDER — FENTANYL CITRATE 0.05 MG/ML IJ SOLN
INTRAMUSCULAR | Status: AC
  Filled 2013-11-26: qty 4

## 2013-11-26 MED ORDER — PROPOFOL 10 MG/ML IV BOLUS
INTRAVENOUS | Status: DC | PRN
  Administered 2013-11-26: 200 mg via INTRAVENOUS

## 2013-11-26 SURGICAL SUPPLY — 44 items
BANDAGE ELASTIC 3 VELCRO ST LF (GAUZE/BANDAGES/DRESSINGS) ×3 IMPLANT
BLADE SURG 15 STRL LF DISP TIS (BLADE) ×1 IMPLANT
BLADE SURG 15 STRL SS (BLADE) ×3
BNDG CMPR 9X4 STRL LF SNTH (GAUZE/BANDAGES/DRESSINGS) ×1
BNDG ESMARK 4X9 LF (GAUZE/BANDAGES/DRESSINGS) ×2 IMPLANT
CORDS BIPOLAR (ELECTRODE) ×3 IMPLANT
COVER TABLE BACK 60X90 (DRAPES) ×3 IMPLANT
CUFF TOURNIQUET SINGLE 18IN (TOURNIQUET CUFF) ×2 IMPLANT
DRAPE EXTREMITY T 121X128X90 (DRAPE) ×3 IMPLANT
DRAPE SURG 17X23 STRL (DRAPES) ×3 IMPLANT
DRAPE U 20/CS (DRAPES) ×3 IMPLANT
DURAPREP 26ML APPLICATOR (WOUND CARE) ×3 IMPLANT
GAUZE XEROFORM 1X8 LF (GAUZE/BANDAGES/DRESSINGS) ×3 IMPLANT
GLOVE BIO SURGEON STRL SZ7 (GLOVE) ×2 IMPLANT
GLOVE BIO SURGEON STRL SZ8 (GLOVE) ×3 IMPLANT
GLOVE BIOGEL PI IND STRL 7.5 (GLOVE) IMPLANT
GLOVE BIOGEL PI IND STRL 8 (GLOVE) ×2 IMPLANT
GLOVE BIOGEL PI INDICATOR 7.5 (GLOVE) ×2
GLOVE BIOGEL PI INDICATOR 8 (GLOVE) ×4
GLOVE EXAM NITRILE MD LF STRL (GLOVE) ×2 IMPLANT
GLOVE ORTHO TXT STRL SZ7.5 (GLOVE) ×3 IMPLANT
GOWN STRL REUS W/ TWL LRG LVL3 (GOWN DISPOSABLE) ×1 IMPLANT
GOWN STRL REUS W/ TWL XL LVL3 (GOWN DISPOSABLE) ×4 IMPLANT
GOWN STRL REUS W/TWL LRG LVL3 (GOWN DISPOSABLE) ×3
GOWN STRL REUS W/TWL XL LVL3 (GOWN DISPOSABLE) ×6
NDL HYPO 25X1 1.5 SAFETY (NEEDLE) IMPLANT
NEEDLE HYPO 25X1 1.5 SAFETY (NEEDLE) ×3 IMPLANT
NS IRRIG 1000ML POUR BTL (IV SOLUTION) ×3 IMPLANT
PACK BASIN DAY SURGERY FS (CUSTOM PROCEDURE TRAY) ×3 IMPLANT
PAD CAST 3X4 CTTN HI CHSV (CAST SUPPLIES) ×1 IMPLANT
PADDING CAST ABS 3INX4YD NS (CAST SUPPLIES) ×2
PADDING CAST ABS 4INX4YD NS (CAST SUPPLIES) ×2
PADDING CAST ABS COTTON 3X4 (CAST SUPPLIES) ×1 IMPLANT
PADDING CAST ABS COTTON 4X4 ST (CAST SUPPLIES) ×1 IMPLANT
PADDING CAST COTTON 3X4 STRL (CAST SUPPLIES) ×3
SPLINT PLASTER CAST XFAST 3X15 (CAST SUPPLIES) IMPLANT
SPLINT PLASTER XTRA FASTSET 3X (CAST SUPPLIES) ×20
SPONGE GAUZE 4X4 12PLY (GAUZE/BANDAGES/DRESSINGS) ×3 IMPLANT
STOCKINETTE 4X48 STRL (DRAPES) ×3 IMPLANT
SUT ETHILON 4 0 PS 2 18 (SUTURE) ×3 IMPLANT
SYR BULB 3OZ (MISCELLANEOUS) ×3 IMPLANT
SYR CONTROL 10ML LL (SYRINGE) ×2 IMPLANT
TOWEL OR 17X24 6PK STRL BLUE (TOWEL DISPOSABLE) ×3 IMPLANT
UNDERPAD 30X30 INCONTINENT (UNDERPADS AND DIAPERS) ×3 IMPLANT

## 2013-11-26 NOTE — Transfer of Care (Signed)
Immediate Anesthesia Transfer of Care Note  Patient: Frank Sharp  Procedure(s) Performed: Procedure(s): LEFT WRIST CARPAL TUNNEL RELEASE  (Left)  Patient Location: PACU  Anesthesia Type:General  Level of Consciousness: awake, sedated and patient cooperative  Airway & Oxygen Therapy: Patient Spontanous Breathing and Patient connected to face mask oxygen  Post-op Assessment: Report given to PACU RN and Post -op Vital signs reviewed and stable  Post vital signs: Reviewed and stable  Complications: No apparent anesthesia complications

## 2013-11-26 NOTE — Discharge Instructions (Signed)
Carpal Tunnel Release Carpal tunnel release is done to relieve the pressure on the nerves and tendons on the bottom side of your wrist.  LET YOUR CAREGIVER KNOW ABOUT:   Allergies to food or medicine.  Medicines taken, including vitamins, herbs, eyedrops, over-the-counter medicines, and creams.  Use of steroids (by mouth or creams).  Previous problems with anesthetics or numbing medicines.  History of bleeding problems or blood clots.  Previous surgery.  Other health problems, including diabetes and kidney problems.  Possibility of pregnancy, if this applies. RISKS AND COMPLICATIONS  Some problems that may happen after this procedure include:  Infection.  Damage to the nerves, arteries or tendons could occur. This would be very uncommon.  Bleeding. BEFORE THE PROCEDURE   This surgery may be done while you are asleep (general anesthetic) or may be done under a block where only your forearm and the surgical area is numb.  If the surgery is done under a block, the numbness will gradually wear off within several hours after surgery. HOME CARE INSTRUCTIONS   Have a responsible person with you for 24 hours.  Do not drive a car or use public transportation for 24 hours.  Only take over-the-counter or prescription medicines for pain, discomfort, or fever as directed by your caregiver. Take them as directed.  You may put ice on the palm side of the affected wrist.  Put ice in a plastic bag.  Place a towel between your skin and the bag.  Leave the ice on for 20 to 30 minutes, 4 times per day.  If you were given a splint to keep your wrist from bending, use it as directed. It is important to wear the splint at night or as directed. Use the splint for as long as you have pain or numbness in your hand, arm, or wrist. This may take 1 to 2 months.  Keep your hand raised (elevated) above the level of your heart as much as possible. This keeps swelling down and helps with  discomfort.  Change bandages (dressings) as directed.  Keep the wound clean and dry. SEEK MEDICAL CARE IF:   You develop pain not relieved with medications.  You develop numbness of your hand.  You develop bleeding from your surgical site.  You have an oral temperature above 102 F (38.9 C).  You develop redness or swelling of the surgical site.  You develop new, unexplained problems. SEEK IMMEDIATE MEDICAL CARE IF:   You develop a rash.  You have difficulty breathing.  You develop any reaction or side effects to medications given. Document Released: 12/14/2003 Document Revised: 12/16/2011 Document Reviewed: 07/30/2007 [facility] Patient Information 2014 [place], [place].  Post Anesthesia Home Care Instructions  Activity: Get plenty of rest for the remainder of the day. A responsible adult should stay with you for 24 hours following the procedure.  For the next 24 hours, DO NOT: -Drive a car -Advertising copywriterperate machinery -Drink alcoholic beverages -Take any medication unless instructed by your physician -Make any legal decisions or sign important papers.  Meals: Start with liquid foods such as gelatin or soup. Progress to regular foods as tolerated. Avoid greasy, spicy, heavy foods. If nausea and/or vomiting occur, drink only clear liquids until the nausea and/or vomiting subsides. Call your physician if vomiting continues.  Special Instructions/Symptoms: Your throat may feel dry or sore from the anesthesia or the breathing tube placed in your throat during surgery. If this causes discomfort, gargle with warm salt water. The discomfort should disappear within  24 hours.

## 2013-11-26 NOTE — Anesthesia Procedure Notes (Signed)
Procedure Name: LMA Insertion Date/Time: 11/26/2013 7:37 AM Performed by: Genevieve NorlanderLINKA, Durenda Pechacek Pre-anesthesia Checklist: Patient identified, Timeout performed, Emergency Drugs available, Suction available and Patient being monitored Patient Re-evaluated:Patient Re-evaluated prior to inductionOxygen Delivery Method: Circle system utilized Preoxygenation: Pre-oxygenation with 100% oxygen Intubation Type: IV induction Ventilation: Mask ventilation without difficulty LMA: LMA inserted LMA Size: 4.0 Number of attempts: 1 Airway Equipment and Method: Patient positioned with wedge pillow Placement Confirmation: positive ETCO2 and breath sounds checked- equal and bilateral Tube secured with: Tape Dental Injury: Teeth and Oropharynx as per pre-operative assessment

## 2013-11-26 NOTE — Anesthesia Preprocedure Evaluation (Signed)
Anesthesia Evaluation  Patient identified by MRN, date of birth, ID band Patient awake    Reviewed: Allergy & Precautions, H&P , NPO status , Patient's Chart, lab work & pertinent test results  Airway Mallampati: II TM Distance: >3 FB Neck ROM: Full    Dental  (+) Teeth Intact, Dental Advisory Given   Pulmonary Current Smoker,  breath sounds clear to auscultation        Cardiovascular Rhythm:Regular Rate:Normal     Neuro/Psych    GI/Hepatic   Endo/Other    Renal/GU      Musculoskeletal   Abdominal   Peds  Hematology   Anesthesia Other Findings   Reproductive/Obstetrics                           Anesthesia Physical Anesthesia Plan  ASA: II  Anesthesia Plan: General   Post-op Pain Management:    Induction: Intravenous  Airway Management Planned: LMA  Additional Equipment:   Intra-op Plan:   Post-operative Plan:   Informed Consent: I have reviewed the patients History and Physical, chart, labs and discussed the procedure including the risks, benefits and alternatives for the proposed anesthesia with the patient or authorized representative who has indicated his/her understanding and acceptance.   Dental advisory given  Plan Discussed with: CRNA and Anesthesiologist  Anesthesia Plan Comments:         Anesthesia Quick Evaluation   

## 2013-11-26 NOTE — Op Note (Signed)
11/26/2013  8:13 AM  PATIENT:  Frank Sharp    PRE-OPERATIVE DIAGNOSIS:  CARPAL TUNNEL SUNDROME LEFT WRIST  POST-OPERATIVE DIAGNOSIS:  Same  PROCEDURE:  LEFT WRIST CARPAL TUNNEL RELEASE   SURGEON:  Eulas PostLANDAU,Cashe Gatt P, MD  PHYSICIAN ASSISTANT: Janace LittenBrandon Parry, OPA-C, present and scrubbed throughout the case, critical for completion in a timely fashion, and for retraction, instrumentation, and closure.  ANESTHESIA:   General  PREOPERATIVE INDICATIONS:  Frank Sharp is a  48 y.o. male with a diagnosis of CARPAL TUNNEL SUNDROME LEFT WRIST who failed conservative measures and elected for surgical management.    The risks benefits and alternatives were discussed with the patient preoperatively including but not limited to the risks of infection, bleeding, nerve injury, incomplete relief of symptoms, pillar pain, cardiopulmonary complications, the need for revision surgery, among others, and the patient was willing to proceed.  OPERATIVE FINDINGS: Minimal atrophy of the nerve.  OPERATIVE PROCEDURE: The patient is brought to the operating room placed in the supine position. General anesthesia was administered. The upper extremity was prepped and draped in usual sterile fashion. Time out was performed. The arm was elevated and exsanguinated and the tourniquet was inflated. Incision was made in line with the radial border of the ring finger. The carpal tunnel transverse fascia was identified, cleaned, and incised sharply. The common sensory branches were visualized along with the superficial palmar arch and protected.  The median nerve was protected below. Deep retractors were placed underneath the transverse carpal ligament, protecting the nerve. I released the ligament completely, and then released the proximal distal volar forearm fascia. The nerve was identified, and visualized, and protected throughout the case. No masses or abnormalities were identified in ulnar bursa.  The wounds were  irrigated copiously, and the wounds injected, and the skin closed with nylon followed by a volar splint and sterile gauze. He tolerated this well, with no complications.

## 2013-11-26 NOTE — Anesthesia Postprocedure Evaluation (Signed)
  Anesthesia Post-op Note  Patient: Frank Sharp  Procedure(s) Performed: Procedure(s): LEFT WRIST CARPAL TUNNEL RELEASE  (Left)  Patient Location: PACU  Anesthesia Type:General  Level of Consciousness: awake, alert  and oriented  Airway and Oxygen Therapy: Patient Spontanous Breathing and Patient connected to nasal cannula oxygen  Post-op Pain: mild  Post-op Assessment: Post-op Vital signs reviewed, Patient's Cardiovascular Status Stable, Respiratory Function Stable, Patent Airway and Pain level controlled  Post-op Vital Signs: stable  Complications: No apparent anesthesia complications

## 2013-11-26 NOTE — H&P (Signed)
PREOPERATIVE H&P  Chief Complaint: CARPAL TUNNEL SUNDROME LEFT WRIST  HPI: Frank Sharp is a 48 y.o. male who presents for preoperative history and physical with a diagnosis of CARPAL TUNNEL SUNDROME LEFT WRIST. Symptoms are rated as moderate to severe, and have been worsening.  This is significantly impairing activities of daily living.  He has elected for surgical management. Had right ctr, did well.  Past Medical History  Diagnosis Date  . Chronic pain   . Depression   . Insomnia   . Diverticulitis   . GERD (gastroesophageal reflux disease)   . Snores   . Right carpal tunnel syndrome 09/10/2013   Past Surgical History  Procedure Laterality Date  . Partial colectomy  2005    diverticulitis  . Cervical fusion  [id number],2012    x2  . Knee arthroscopy      right and left  . Hernia repair  1992    rt ing  . Colonoscopy    . Carpal tunnel release Right 09/10/2013    Procedure: RIGHT WRIST CARPAL TUNNEL RELEASE;  Surgeon: Eulas PostJoshua P Lacy Taglieri, MD;  Location: [facility];  Service: Orthopedics;  Laterality: Right;   History   Social History  . Marital Status: Legally Separated    Spouse Name: N/A    Number of Children: N/A  . Years of Education: N/A   Social History Main Topics  . Smoking status: Current Every Day Smoker -- 0.50 packs/day  . Smokeless tobacco: None  . Alcohol Use: Yes     Comment: occ  . Drug Use: No  . Sexual Activity: None   Other Topics Concern  . None   Social History Narrative  . None   History reviewed. No pertinent family history. Allergies  Allergen Reactions  . Sulfa Antibiotics Nausea And Vomiting   Prior to Admission medications   Medication Sig Start Date End Date Taking? Authorizing Provider  Ascorbic Acid (VITAMIN C) 1000 MG tablet Take 1,000 mg by mouth daily.   Yes Historical Provider, MD  diazepam (VALIUM) 10 MG tablet Take 10 mg by mouth at bedtime as needed for anxiety.   Yes Historical Provider, MD  omeprazole  (PRILOSEC) 20 MG capsule Take 20 mg by mouth daily.   Yes Historical Provider, MD  oxyCODONE-acetaminophen (PERCOCET) 10-325 MG per tablet Take 1-2 tablets by mouth every 6 (six) hours as needed for pain. MAXIMUM TOTAL ACETAMINOPHEN DOSE IS 4000 MG PER DAY 09/10/13  Yes Eulas PostJoshua P Kanoelani Dobies, MD  promethazine (PHENERGAN) 25 MG tablet Take 1 tablet (25 mg total) by mouth every 6 (six) hours as needed for nausea or vomiting. 09/10/13  Yes Eulas PostJoshua P Maryanna Stuber, MD  sertraline (ZOLOFT) 100 MG tablet Take 100 mg by mouth daily. Takes a total 150mg /daily   Yes Historical Provider, MD  sertraline (ZOLOFT) 50 MG tablet Take 50 mg by mouth daily.   Yes Historical Provider, MD     Positive ROS: All other systems have been reviewed and were otherwise negative with the exception of those mentioned in the HPI and as above.  Physical Exam: General: Alert, no acute distress Cardiovascular: No pedal edema Respiratory: No cyanosis, no use of accessory musculature GI: No organomegaly, abdomen is soft and non-tender Skin: No lesions in the area of chief complaint Neurologic: Sensation intact distally Psychiatric: Patient is competent for consent with normal mood and affect Lymphatic: No axillary or cervical lymphadenopathy  MUSCULOSKELETAL: left positive phalens.  Assessment: CARPAL TUNNEL SUNDROME LEFT WRIST  Plan: Plan for Procedure(s):  LEFT WRIST CARPAL TUNNEL RELEASE   The risks benefits and alternatives were discussed with the patient including but not limited to the risks of nonoperative treatment, versus surgical intervention including infection, bleeding, nerve injury,  blood clots, cardiopulmonary complications, morbidity, mortality, among others, and they were willing to proceed.   Eulas Post, MD Cell [phone]   11/26/2013 7:23 AM

## 2013-11-26 NOTE — Transfer of Care (Signed)
Immediate Anesthesia Transfer of Care Note  Patient: Frank SimmondsHubert C Sharp  Procedure(s) Performed: Procedure(s): LEFT WRIST CARPAL TUNNEL RELEASE  (Left)  Patient Location: PACU  Anesthesia Type:General  Level of Consciousness: awake and sedated  Airway & Oxygen Therapy: Patient Spontanous Breathing and Patient connected to face mask oxygen  Post-op Assessment: Report given to PACU RN and Post -op Vital signs reviewed and stable  Post vital signs: Reviewed and stable  Complications: No apparent anesthesia complications

## 2013-11-29 ENCOUNTER — Encounter (HOSPITAL_BASED_OUTPATIENT_CLINIC_OR_DEPARTMENT_OTHER): Payer: Self-pay | Admitting: Orthopedic Surgery

## 2014-12-12 DIAGNOSIS — M5412 Radiculopathy, cervical region: Secondary | ICD-10-CM | POA: Diagnosis not present

## 2015-02-20 DIAGNOSIS — Z791 Long term (current) use of non-steroidal anti-inflammatories (NSAID): Secondary | ICD-10-CM | POA: Diagnosis not present

## 2015-02-20 DIAGNOSIS — M542 Cervicalgia: Secondary | ICD-10-CM | POA: Diagnosis not present

## 2015-02-20 DIAGNOSIS — F17200 Nicotine dependence, unspecified, uncomplicated: Secondary | ICD-10-CM | POA: Diagnosis not present

## 2015-02-20 DIAGNOSIS — G8929 Other chronic pain: Secondary | ICD-10-CM | POA: Diagnosis not present

## 2015-02-20 DIAGNOSIS — Z79899 Other long term (current) drug therapy: Secondary | ICD-10-CM | POA: Diagnosis not present

## 2015-02-20 DIAGNOSIS — F419 Anxiety disorder, unspecified: Secondary | ICD-10-CM | POA: Diagnosis not present

## 2015-02-20 DIAGNOSIS — R1030 Lower abdominal pain, unspecified: Secondary | ICD-10-CM | POA: Diagnosis not present

## 2015-02-20 DIAGNOSIS — Z882 Allergy status to sulfonamides status: Secondary | ICD-10-CM | POA: Diagnosis not present

## 2015-03-23 DIAGNOSIS — M19012 Primary osteoarthritis, left shoulder: Secondary | ICD-10-CM | POA: Diagnosis not present

## 2015-03-23 DIAGNOSIS — M75122 Complete rotator cuff tear or rupture of left shoulder, not specified as traumatic: Secondary | ICD-10-CM | POA: Diagnosis not present

## 2015-03-23 DIAGNOSIS — M7522 Bicipital tendinitis, left shoulder: Secondary | ICD-10-CM | POA: Diagnosis not present

## 2015-03-23 DIAGNOSIS — Z882 Allergy status to sulfonamides status: Secondary | ICD-10-CM | POA: Diagnosis not present

## 2015-03-23 DIAGNOSIS — K219 Gastro-esophageal reflux disease without esophagitis: Secondary | ICD-10-CM | POA: Diagnosis not present

## 2015-03-23 DIAGNOSIS — F17200 Nicotine dependence, unspecified, uncomplicated: Secondary | ICD-10-CM | POA: Diagnosis not present

## 2015-03-23 DIAGNOSIS — I10 Essential (primary) hypertension: Secondary | ICD-10-CM | POA: Diagnosis not present

## 2015-03-23 DIAGNOSIS — Z716 Tobacco abuse counseling: Secondary | ICD-10-CM | POA: Diagnosis not present

## 2015-03-23 DIAGNOSIS — S43432A Superior glenoid labrum lesion of left shoulder, initial encounter: Secondary | ICD-10-CM | POA: Diagnosis not present

## 2015-03-23 DIAGNOSIS — Z79899 Other long term (current) drug therapy: Secondary | ICD-10-CM | POA: Diagnosis not present

## 2015-03-23 DIAGNOSIS — F419 Anxiety disorder, unspecified: Secondary | ICD-10-CM | POA: Diagnosis not present

## 2015-03-23 DIAGNOSIS — Z791 Long term (current) use of non-steroidal anti-inflammatories (NSAID): Secondary | ICD-10-CM | POA: Diagnosis not present

## 2015-05-08 DIAGNOSIS — M75102 Unspecified rotator cuff tear or rupture of left shoulder, not specified as traumatic: Secondary | ICD-10-CM | POA: Diagnosis not present

## 2015-05-08 DIAGNOSIS — M25512 Pain in left shoulder: Secondary | ICD-10-CM | POA: Diagnosis not present

## 2015-05-08 DIAGNOSIS — Z9889 Other specified postprocedural states: Secondary | ICD-10-CM | POA: Diagnosis not present

## 2015-05-17 DIAGNOSIS — M75102 Unspecified rotator cuff tear or rupture of left shoulder, not specified as traumatic: Secondary | ICD-10-CM | POA: Diagnosis not present

## 2015-05-17 DIAGNOSIS — M25512 Pain in left shoulder: Secondary | ICD-10-CM | POA: Diagnosis not present

## 2015-05-17 DIAGNOSIS — Z9889 Other specified postprocedural states: Secondary | ICD-10-CM | POA: Diagnosis not present

## 2015-05-19 DIAGNOSIS — M25512 Pain in left shoulder: Secondary | ICD-10-CM | POA: Diagnosis not present

## 2015-05-19 DIAGNOSIS — Z9889 Other specified postprocedural states: Secondary | ICD-10-CM | POA: Diagnosis not present

## 2015-05-19 DIAGNOSIS — M75102 Unspecified rotator cuff tear or rupture of left shoulder, not specified as traumatic: Secondary | ICD-10-CM | POA: Diagnosis not present

## 2015-05-24 DIAGNOSIS — M25512 Pain in left shoulder: Secondary | ICD-10-CM | POA: Diagnosis not present

## 2015-05-24 DIAGNOSIS — M75102 Unspecified rotator cuff tear or rupture of left shoulder, not specified as traumatic: Secondary | ICD-10-CM | POA: Diagnosis not present

## 2015-05-24 DIAGNOSIS — Z9889 Other specified postprocedural states: Secondary | ICD-10-CM | POA: Diagnosis not present

## 2015-05-26 DIAGNOSIS — M75102 Unspecified rotator cuff tear or rupture of left shoulder, not specified as traumatic: Secondary | ICD-10-CM | POA: Diagnosis not present

## 2015-05-26 DIAGNOSIS — Z9889 Other specified postprocedural states: Secondary | ICD-10-CM | POA: Diagnosis not present

## 2015-05-26 DIAGNOSIS — M25512 Pain in left shoulder: Secondary | ICD-10-CM | POA: Diagnosis not present

## 2015-05-31 DIAGNOSIS — M25512 Pain in left shoulder: Secondary | ICD-10-CM | POA: Diagnosis not present

## 2015-05-31 DIAGNOSIS — Z9889 Other specified postprocedural states: Secondary | ICD-10-CM | POA: Diagnosis not present

## 2015-05-31 DIAGNOSIS — M75102 Unspecified rotator cuff tear or rupture of left shoulder, not specified as traumatic: Secondary | ICD-10-CM | POA: Diagnosis not present

## 2015-06-06 DIAGNOSIS — M25512 Pain in left shoulder: Secondary | ICD-10-CM | POA: Diagnosis not present

## 2015-06-06 DIAGNOSIS — M75102 Unspecified rotator cuff tear or rupture of left shoulder, not specified as traumatic: Secondary | ICD-10-CM | POA: Diagnosis not present

## 2015-06-06 DIAGNOSIS — Z9889 Other specified postprocedural states: Secondary | ICD-10-CM | POA: Diagnosis not present

## 2015-06-07 DIAGNOSIS — K219 Gastro-esophageal reflux disease without esophagitis: Secondary | ICD-10-CM | POA: Diagnosis not present

## 2015-06-07 DIAGNOSIS — N529 Male erectile dysfunction, unspecified: Secondary | ICD-10-CM | POA: Diagnosis not present

## 2015-06-07 DIAGNOSIS — Z0001 Encounter for general adult medical examination with abnormal findings: Secondary | ICD-10-CM | POA: Diagnosis not present

## 2015-06-07 DIAGNOSIS — E291 Testicular hypofunction: Secondary | ICD-10-CM | POA: Diagnosis not present

## 2015-06-07 DIAGNOSIS — I10 Essential (primary) hypertension: Secondary | ICD-10-CM | POA: Diagnosis not present

## 2015-06-07 DIAGNOSIS — Z13220 Encounter for screening for lipoid disorders: Secondary | ICD-10-CM | POA: Diagnosis not present

## 2015-06-07 DIAGNOSIS — F17290 Nicotine dependence, other tobacco product, uncomplicated: Secondary | ICD-10-CM | POA: Diagnosis not present

## 2015-06-07 DIAGNOSIS — Z9889 Other specified postprocedural states: Secondary | ICD-10-CM | POA: Diagnosis not present

## 2015-06-08 DIAGNOSIS — M75102 Unspecified rotator cuff tear or rupture of left shoulder, not specified as traumatic: Secondary | ICD-10-CM | POA: Diagnosis not present

## 2015-06-08 DIAGNOSIS — Z9889 Other specified postprocedural states: Secondary | ICD-10-CM | POA: Diagnosis not present

## 2015-06-08 DIAGNOSIS — M25512 Pain in left shoulder: Secondary | ICD-10-CM | POA: Diagnosis not present

## 2015-06-14 DIAGNOSIS — M25512 Pain in left shoulder: Secondary | ICD-10-CM | POA: Diagnosis not present

## 2015-06-14 DIAGNOSIS — Z9889 Other specified postprocedural states: Secondary | ICD-10-CM | POA: Diagnosis not present

## 2015-06-14 DIAGNOSIS — M75102 Unspecified rotator cuff tear or rupture of left shoulder, not specified as traumatic: Secondary | ICD-10-CM | POA: Diagnosis not present

## 2015-07-08 DIAGNOSIS — M75102 Unspecified rotator cuff tear or rupture of left shoulder, not specified as traumatic: Secondary | ICD-10-CM | POA: Diagnosis not present

## 2015-07-08 DIAGNOSIS — M25512 Pain in left shoulder: Secondary | ICD-10-CM | POA: Diagnosis not present

## 2015-07-11 DIAGNOSIS — M75102 Unspecified rotator cuff tear or rupture of left shoulder, not specified as traumatic: Secondary | ICD-10-CM | POA: Diagnosis not present

## 2015-07-11 DIAGNOSIS — M25512 Pain in left shoulder: Secondary | ICD-10-CM | POA: Diagnosis not present

## 2015-07-12 DIAGNOSIS — M7542 Impingement syndrome of left shoulder: Secondary | ICD-10-CM | POA: Diagnosis not present

## 2015-07-12 DIAGNOSIS — Z9889 Other specified postprocedural states: Secondary | ICD-10-CM | POA: Diagnosis not present

## 2015-07-31 DIAGNOSIS — R1013 Epigastric pain: Secondary | ICD-10-CM | POA: Diagnosis not present

## 2015-07-31 DIAGNOSIS — K429 Umbilical hernia without obstruction or gangrene: Secondary | ICD-10-CM | POA: Diagnosis not present

## 2015-07-31 DIAGNOSIS — R1011 Right upper quadrant pain: Secondary | ICD-10-CM | POA: Diagnosis not present

## 2015-08-04 DIAGNOSIS — R1011 Right upper quadrant pain: Secondary | ICD-10-CM | POA: Diagnosis not present

## 2015-09-18 DIAGNOSIS — G894 Chronic pain syndrome: Secondary | ICD-10-CM | POA: Diagnosis not present

## 2015-09-18 DIAGNOSIS — I10 Essential (primary) hypertension: Secondary | ICD-10-CM | POA: Diagnosis not present

## 2015-09-18 DIAGNOSIS — Z79891 Long term (current) use of opiate analgesic: Secondary | ICD-10-CM | POA: Diagnosis not present

## 2015-09-18 DIAGNOSIS — F419 Anxiety disorder, unspecified: Secondary | ICD-10-CM | POA: Diagnosis not present

## 2015-10-15 DIAGNOSIS — Z87891 Personal history of nicotine dependence: Secondary | ICD-10-CM | POA: Diagnosis not present

## 2015-10-15 DIAGNOSIS — M25512 Pain in left shoulder: Secondary | ICD-10-CM | POA: Diagnosis not present

## 2015-10-15 DIAGNOSIS — M542 Cervicalgia: Secondary | ICD-10-CM | POA: Diagnosis not present

## 2015-10-15 DIAGNOSIS — I10 Essential (primary) hypertension: Secondary | ICD-10-CM | POA: Diagnosis not present

## 2015-10-15 DIAGNOSIS — M5031 Other cervical disc degeneration,  high cervical region: Secondary | ICD-10-CM | POA: Diagnosis not present

## 2015-10-15 DIAGNOSIS — M25812 Other specified joint disorders, left shoulder: Secondary | ICD-10-CM | POA: Diagnosis not present

## 2015-10-15 DIAGNOSIS — S134XXA Sprain of ligaments of cervical spine, initial encounter: Secondary | ICD-10-CM | POA: Diagnosis not present

## 2015-10-15 DIAGNOSIS — S46002A Unspecified injury of muscle(s) and tendon(s) of the rotator cuff of left shoulder, initial encounter: Secondary | ICD-10-CM | POA: Diagnosis not present

## 2015-10-16 DIAGNOSIS — M25512 Pain in left shoulder: Secondary | ICD-10-CM | POA: Diagnosis not present

## 2015-10-16 DIAGNOSIS — G8929 Other chronic pain: Secondary | ICD-10-CM | POA: Diagnosis not present

## 2015-10-16 DIAGNOSIS — R110 Nausea: Secondary | ICD-10-CM | POA: Diagnosis not present

## 2015-10-16 DIAGNOSIS — W19XXXD Unspecified fall, subsequent encounter: Secondary | ICD-10-CM | POA: Diagnosis not present

## 2015-10-16 DIAGNOSIS — M542 Cervicalgia: Secondary | ICD-10-CM | POA: Diagnosis not present

## 2015-10-16 DIAGNOSIS — M25511 Pain in right shoulder: Secondary | ICD-10-CM | POA: Diagnosis not present

## 2015-10-19 DIAGNOSIS — F17210 Nicotine dependence, cigarettes, uncomplicated: Secondary | ICD-10-CM | POA: Diagnosis not present

## 2015-10-19 DIAGNOSIS — R51 Headache: Secondary | ICD-10-CM | POA: Diagnosis not present

## 2015-10-19 DIAGNOSIS — S139XXD Sprain of joints and ligaments of unspecified parts of neck, subsequent encounter: Secondary | ICD-10-CM | POA: Diagnosis not present

## 2015-10-19 DIAGNOSIS — M542 Cervicalgia: Secondary | ICD-10-CM | POA: Diagnosis not present

## 2015-10-19 DIAGNOSIS — M62838 Other muscle spasm: Secondary | ICD-10-CM | POA: Diagnosis not present

## 2015-10-19 DIAGNOSIS — G8929 Other chronic pain: Secondary | ICD-10-CM | POA: Diagnosis not present

## 2015-10-23 DIAGNOSIS — R51 Headache: Secondary | ICD-10-CM | POA: Diagnosis not present

## 2015-10-23 DIAGNOSIS — S0990XA Unspecified injury of head, initial encounter: Secondary | ICD-10-CM | POA: Diagnosis not present

## 2015-10-23 DIAGNOSIS — M546 Pain in thoracic spine: Secondary | ICD-10-CM | POA: Diagnosis not present

## 2015-10-23 DIAGNOSIS — M25512 Pain in left shoulder: Secondary | ICD-10-CM | POA: Diagnosis not present

## 2015-10-23 DIAGNOSIS — S139XXA Sprain of joints and ligaments of unspecified parts of neck, initial encounter: Secondary | ICD-10-CM | POA: Diagnosis not present

## 2015-10-23 DIAGNOSIS — S43402A Unspecified sprain of left shoulder joint, initial encounter: Secondary | ICD-10-CM | POA: Diagnosis not present

## 2015-10-23 DIAGNOSIS — I10 Essential (primary) hypertension: Secondary | ICD-10-CM | POA: Diagnosis not present

## 2015-10-25 DIAGNOSIS — M25519 Pain in unspecified shoulder: Secondary | ICD-10-CM | POA: Diagnosis not present

## 2015-10-25 DIAGNOSIS — M5412 Radiculopathy, cervical region: Secondary | ICD-10-CM | POA: Diagnosis not present

## 2015-10-30 DIAGNOSIS — M50221 Other cervical disc displacement at C4-C5 level: Secondary | ICD-10-CM | POA: Diagnosis not present

## 2015-11-07 DIAGNOSIS — M5412 Radiculopathy, cervical region: Secondary | ICD-10-CM | POA: Diagnosis not present

## 2015-11-07 DIAGNOSIS — M4722 Other spondylosis with radiculopathy, cervical region: Secondary | ICD-10-CM | POA: Diagnosis not present

## 2015-11-07 DIAGNOSIS — M4802 Spinal stenosis, cervical region: Secondary | ICD-10-CM | POA: Diagnosis not present

## 2015-11-17 DIAGNOSIS — M25562 Pain in left knee: Secondary | ICD-10-CM | POA: Diagnosis not present

## 2015-11-21 DIAGNOSIS — M2392 Unspecified internal derangement of left knee: Secondary | ICD-10-CM | POA: Diagnosis not present

## 2015-11-21 DIAGNOSIS — S40012A Contusion of left shoulder, initial encounter: Secondary | ICD-10-CM | POA: Diagnosis not present

## 2015-11-24 DIAGNOSIS — M4722 Other spondylosis with radiculopathy, cervical region: Secondary | ICD-10-CM | POA: Diagnosis not present

## 2015-11-24 DIAGNOSIS — M4802 Spinal stenosis, cervical region: Secondary | ICD-10-CM | POA: Diagnosis not present

## 2015-11-24 DIAGNOSIS — M5412 Radiculopathy, cervical region: Secondary | ICD-10-CM | POA: Diagnosis not present

## 2015-11-27 DIAGNOSIS — M4722 Other spondylosis with radiculopathy, cervical region: Secondary | ICD-10-CM | POA: Diagnosis not present

## 2015-11-28 DIAGNOSIS — M150 Primary generalized (osteo)arthritis: Secondary | ICD-10-CM | POA: Diagnosis not present

## 2015-11-28 DIAGNOSIS — E291 Testicular hypofunction: Secondary | ICD-10-CM | POA: Diagnosis not present

## 2015-11-28 DIAGNOSIS — Z6826 Body mass index (BMI) 26.0-26.9, adult: Secondary | ICD-10-CM | POA: Diagnosis not present

## 2015-11-28 DIAGNOSIS — I10 Essential (primary) hypertension: Secondary | ICD-10-CM | POA: Diagnosis not present

## 2015-11-28 DIAGNOSIS — F411 Generalized anxiety disorder: Secondary | ICD-10-CM | POA: Diagnosis not present

## 2015-11-29 DIAGNOSIS — M4802 Spinal stenosis, cervical region: Secondary | ICD-10-CM | POA: Diagnosis not present

## 2015-11-29 DIAGNOSIS — M5412 Radiculopathy, cervical region: Secondary | ICD-10-CM | POA: Diagnosis not present

## 2015-11-29 DIAGNOSIS — M4722 Other spondylosis with radiculopathy, cervical region: Secondary | ICD-10-CM | POA: Diagnosis not present

## 2015-11-29 DIAGNOSIS — K219 Gastro-esophageal reflux disease without esophagitis: Secondary | ICD-10-CM | POA: Diagnosis not present

## 2015-11-29 DIAGNOSIS — I10 Essential (primary) hypertension: Secondary | ICD-10-CM | POA: Diagnosis not present

## 2015-11-29 DIAGNOSIS — M4322 Fusion of spine, cervical region: Secondary | ICD-10-CM | POA: Diagnosis not present

## 2015-11-29 DIAGNOSIS — Z79899 Other long term (current) drug therapy: Secondary | ICD-10-CM | POA: Diagnosis not present

## 2015-11-30 DIAGNOSIS — Z79899 Other long term (current) drug therapy: Secondary | ICD-10-CM | POA: Diagnosis not present

## 2015-11-30 DIAGNOSIS — M5412 Radiculopathy, cervical region: Secondary | ICD-10-CM | POA: Diagnosis not present

## 2015-11-30 DIAGNOSIS — I10 Essential (primary) hypertension: Secondary | ICD-10-CM | POA: Diagnosis not present

## 2015-11-30 DIAGNOSIS — K219 Gastro-esophageal reflux disease without esophagitis: Secondary | ICD-10-CM | POA: Diagnosis not present

## 2015-12-08 DIAGNOSIS — M150 Primary generalized (osteo)arthritis: Secondary | ICD-10-CM | POA: Diagnosis not present

## 2015-12-08 DIAGNOSIS — I10 Essential (primary) hypertension: Secondary | ICD-10-CM | POA: Diagnosis not present

## 2015-12-08 DIAGNOSIS — Z6826 Body mass index (BMI) 26.0-26.9, adult: Secondary | ICD-10-CM | POA: Diagnosis not present

## 2015-12-08 DIAGNOSIS — F411 Generalized anxiety disorder: Secondary | ICD-10-CM | POA: Diagnosis not present

## 2015-12-08 DIAGNOSIS — E291 Testicular hypofunction: Secondary | ICD-10-CM | POA: Diagnosis not present

## 2015-12-26 DIAGNOSIS — E291 Testicular hypofunction: Secondary | ICD-10-CM | POA: Diagnosis not present

## 2015-12-26 DIAGNOSIS — N400 Enlarged prostate without lower urinary tract symptoms: Secondary | ICD-10-CM | POA: Diagnosis not present

## 2015-12-27 DIAGNOSIS — M4802 Spinal stenosis, cervical region: Secondary | ICD-10-CM | POA: Diagnosis not present

## 2016-02-21 DIAGNOSIS — M4322 Fusion of spine, cervical region: Secondary | ICD-10-CM | POA: Diagnosis not present

## 2016-03-12 DIAGNOSIS — F411 Generalized anxiety disorder: Secondary | ICD-10-CM | POA: Diagnosis not present

## 2016-03-12 DIAGNOSIS — E7800 Pure hypercholesterolemia, unspecified: Secondary | ICD-10-CM | POA: Diagnosis not present

## 2016-03-12 DIAGNOSIS — M47812 Spondylosis without myelopathy or radiculopathy, cervical region: Secondary | ICD-10-CM | POA: Diagnosis not present

## 2016-03-12 DIAGNOSIS — Z6827 Body mass index (BMI) 27.0-27.9, adult: Secondary | ICD-10-CM | POA: Diagnosis not present

## 2016-03-12 DIAGNOSIS — Z79899 Other long term (current) drug therapy: Secondary | ICD-10-CM | POA: Diagnosis not present

## 2016-03-12 DIAGNOSIS — E291 Testicular hypofunction: Secondary | ICD-10-CM | POA: Diagnosis not present

## 2016-03-12 DIAGNOSIS — I10 Essential (primary) hypertension: Secondary | ICD-10-CM | POA: Diagnosis not present

## 2016-03-22 DIAGNOSIS — I10 Essential (primary) hypertension: Secondary | ICD-10-CM | POA: Diagnosis not present

## 2016-03-22 DIAGNOSIS — F411 Generalized anxiety disorder: Secondary | ICD-10-CM | POA: Diagnosis not present

## 2016-03-22 DIAGNOSIS — E291 Testicular hypofunction: Secondary | ICD-10-CM | POA: Diagnosis not present

## 2016-03-22 DIAGNOSIS — Z6827 Body mass index (BMI) 27.0-27.9, adult: Secondary | ICD-10-CM | POA: Diagnosis not present

## 2016-03-22 DIAGNOSIS — M47812 Spondylosis without myelopathy or radiculopathy, cervical region: Secondary | ICD-10-CM | POA: Diagnosis not present

## 2016-04-11 DIAGNOSIS — R5382 Chronic fatigue, unspecified: Secondary | ICD-10-CM | POA: Diagnosis not present

## 2016-07-01 DIAGNOSIS — N400 Enlarged prostate without lower urinary tract symptoms: Secondary | ICD-10-CM | POA: Diagnosis not present

## 2016-07-01 DIAGNOSIS — E291 Testicular hypofunction: Secondary | ICD-10-CM | POA: Diagnosis not present

## 2016-07-16 DIAGNOSIS — E349 Endocrine disorder, unspecified: Secondary | ICD-10-CM | POA: Diagnosis not present

## 2016-09-18 DIAGNOSIS — R718 Other abnormality of red blood cells: Secondary | ICD-10-CM | POA: Diagnosis not present

## 2016-09-18 DIAGNOSIS — Z125 Encounter for screening for malignant neoplasm of prostate: Secondary | ICD-10-CM | POA: Diagnosis not present

## 2016-09-18 DIAGNOSIS — M47812 Spondylosis without myelopathy or radiculopathy, cervical region: Secondary | ICD-10-CM | POA: Diagnosis not present

## 2016-09-18 DIAGNOSIS — Z79899 Other long term (current) drug therapy: Secondary | ICD-10-CM | POA: Diagnosis not present

## 2016-09-18 DIAGNOSIS — Z6827 Body mass index (BMI) 27.0-27.9, adult: Secondary | ICD-10-CM | POA: Diagnosis not present

## 2016-09-18 DIAGNOSIS — I10 Essential (primary) hypertension: Secondary | ICD-10-CM | POA: Diagnosis not present

## 2016-09-18 DIAGNOSIS — R3915 Urgency of urination: Secondary | ICD-10-CM | POA: Diagnosis not present

## 2016-09-18 DIAGNOSIS — E291 Testicular hypofunction: Secondary | ICD-10-CM | POA: Diagnosis not present

## 2016-09-18 DIAGNOSIS — R350 Frequency of micturition: Secondary | ICD-10-CM | POA: Diagnosis not present

## 2016-11-07 DIAGNOSIS — N401 Enlarged prostate with lower urinary tract symptoms: Secondary | ICD-10-CM | POA: Diagnosis not present

## 2016-11-07 DIAGNOSIS — I10 Essential (primary) hypertension: Secondary | ICD-10-CM | POA: Diagnosis not present

## 2016-11-07 DIAGNOSIS — Z6826 Body mass index (BMI) 26.0-26.9, adult: Secondary | ICD-10-CM | POA: Diagnosis not present

## 2016-11-07 DIAGNOSIS — F411 Generalized anxiety disorder: Secondary | ICD-10-CM | POA: Diagnosis not present

## 2016-11-07 DIAGNOSIS — Z0000 Encounter for general adult medical examination without abnormal findings: Secondary | ICD-10-CM | POA: Diagnosis not present

## 2016-11-07 DIAGNOSIS — Z1389 Encounter for screening for other disorder: Secondary | ICD-10-CM | POA: Diagnosis not present

## 2016-11-07 DIAGNOSIS — M47812 Spondylosis without myelopathy or radiculopathy, cervical region: Secondary | ICD-10-CM | POA: Diagnosis not present

## 2017-01-29 DIAGNOSIS — I10 Essential (primary) hypertension: Secondary | ICD-10-CM | POA: Diagnosis not present

## 2017-01-29 DIAGNOSIS — R7989 Other specified abnormal findings of blood chemistry: Secondary | ICD-10-CM | POA: Diagnosis not present

## 2020-11-08 ENCOUNTER — Telehealth: Payer: Self-pay

## 2020-11-08 NOTE — Telephone Encounter (Signed)
Erskine Squibb from [facility] called with Chest Xray results. Chest Xray results as follow: Slight increase density in the right upper lobe, may represent scaring or possible recurrent neoplasm. Deserve consideration for CT scan is recommended. Dr. Melvyn Neth will be made aware.

## 2023-05-08 DIAGNOSIS — R0789 Other chest pain: Secondary | ICD-10-CM | POA: Diagnosis not present

## 2023-05-08 DIAGNOSIS — R079 Chest pain, unspecified: Secondary | ICD-10-CM | POA: Diagnosis not present

## 2023-05-08 DIAGNOSIS — F17210 Nicotine dependence, cigarettes, uncomplicated: Secondary | ICD-10-CM | POA: Diagnosis not present

## 2023-05-08 DIAGNOSIS — R0602 Shortness of breath: Secondary | ICD-10-CM | POA: Diagnosis not present

## 2023-05-13 DIAGNOSIS — K219 Gastro-esophageal reflux disease without esophagitis: Secondary | ICD-10-CM | POA: Diagnosis not present

## 2023-05-13 DIAGNOSIS — Z79899 Other long term (current) drug therapy: Secondary | ICD-10-CM | POA: Diagnosis not present

## 2023-05-13 DIAGNOSIS — I10 Essential (primary) hypertension: Secondary | ICD-10-CM | POA: Diagnosis not present

## 2023-05-13 DIAGNOSIS — M791 Myalgia, unspecified site: Secondary | ICD-10-CM | POA: Diagnosis not present

## 2023-05-21 DIAGNOSIS — M170 Bilateral primary osteoarthritis of knee: Secondary | ICD-10-CM | POA: Diagnosis not present

## 2023-05-22 DIAGNOSIS — J301 Allergic rhinitis due to pollen: Secondary | ICD-10-CM | POA: Diagnosis not present

## 2023-05-22 DIAGNOSIS — T63421A Toxic effect of venom of ants, accidental (unintentional), initial encounter: Secondary | ICD-10-CM | POA: Diagnosis not present

## 2023-05-28 DIAGNOSIS — M25622 Stiffness of left elbow, not elsewhere classified: Secondary | ICD-10-CM | POA: Diagnosis not present

## 2023-05-28 DIAGNOSIS — M25522 Pain in left elbow: Secondary | ICD-10-CM | POA: Diagnosis not present

## 2023-05-28 DIAGNOSIS — J3089 Other allergic rhinitis: Secondary | ICD-10-CM | POA: Diagnosis not present

## 2023-05-28 DIAGNOSIS — S53432A Radial collateral ligament sprain of left elbow, initial encounter: Secondary | ICD-10-CM | POA: Diagnosis not present

## 2023-05-28 DIAGNOSIS — Z789 Other specified health status: Secondary | ICD-10-CM | POA: Diagnosis not present

## 2023-05-28 DIAGNOSIS — S53432D Radial collateral ligament sprain of left elbow, subsequent encounter: Secondary | ICD-10-CM | POA: Diagnosis not present

## 2023-05-29 DIAGNOSIS — J3089 Other allergic rhinitis: Secondary | ICD-10-CM | POA: Diagnosis not present

## 2023-05-29 DIAGNOSIS — Z79891 Long term (current) use of opiate analgesic: Secondary | ICD-10-CM | POA: Diagnosis not present

## 2023-07-07 DIAGNOSIS — Z9889 Other specified postprocedural states: Secondary | ICD-10-CM | POA: Diagnosis not present

## 2023-07-07 DIAGNOSIS — S53432D Radial collateral ligament sprain of left elbow, subsequent encounter: Secondary | ICD-10-CM | POA: Diagnosis not present

## 2023-07-07 DIAGNOSIS — S53432A Radial collateral ligament sprain of left elbow, initial encounter: Secondary | ICD-10-CM | POA: Diagnosis not present

## 2023-07-09 DIAGNOSIS — M25561 Pain in right knee: Secondary | ICD-10-CM | POA: Diagnosis not present

## 2023-07-09 DIAGNOSIS — M25562 Pain in left knee: Secondary | ICD-10-CM | POA: Diagnosis not present

## 2023-07-09 DIAGNOSIS — M170 Bilateral primary osteoarthritis of knee: Secondary | ICD-10-CM | POA: Diagnosis not present

## 2023-07-09 DIAGNOSIS — G8929 Other chronic pain: Secondary | ICD-10-CM | POA: Diagnosis not present

## 2023-07-16 DIAGNOSIS — S53432A Radial collateral ligament sprain of left elbow, initial encounter: Secondary | ICD-10-CM | POA: Diagnosis not present

## 2023-07-16 DIAGNOSIS — Z9889 Other specified postprocedural states: Secondary | ICD-10-CM | POA: Diagnosis not present

## 2023-07-16 DIAGNOSIS — M25522 Pain in left elbow: Secondary | ICD-10-CM | POA: Diagnosis not present

## 2023-07-16 DIAGNOSIS — S53432D Radial collateral ligament sprain of left elbow, subsequent encounter: Secondary | ICD-10-CM | POA: Diagnosis not present

## 2023-07-16 DIAGNOSIS — M25622 Stiffness of left elbow, not elsewhere classified: Secondary | ICD-10-CM | POA: Diagnosis not present

## 2023-07-16 DIAGNOSIS — Z789 Other specified health status: Secondary | ICD-10-CM | POA: Diagnosis not present

## 2023-07-24 DIAGNOSIS — M170 Bilateral primary osteoarthritis of knee: Secondary | ICD-10-CM | POA: Diagnosis not present

## 2023-07-24 DIAGNOSIS — Z79891 Long term (current) use of opiate analgesic: Secondary | ICD-10-CM | POA: Diagnosis not present

## 2023-08-06 DIAGNOSIS — Z9889 Other specified postprocedural states: Secondary | ICD-10-CM | POA: Diagnosis not present

## 2023-08-06 DIAGNOSIS — S53432A Radial collateral ligament sprain of left elbow, initial encounter: Secondary | ICD-10-CM | POA: Diagnosis not present

## 2023-08-06 DIAGNOSIS — M25622 Stiffness of left elbow, not elsewhere classified: Secondary | ICD-10-CM | POA: Diagnosis not present

## 2023-08-06 DIAGNOSIS — M25522 Pain in left elbow: Secondary | ICD-10-CM | POA: Diagnosis not present

## 2023-08-06 DIAGNOSIS — S53432D Radial collateral ligament sprain of left elbow, subsequent encounter: Secondary | ICD-10-CM | POA: Diagnosis not present

## 2023-08-06 DIAGNOSIS — Z789 Other specified health status: Secondary | ICD-10-CM | POA: Diagnosis not present

## 2023-08-13 DIAGNOSIS — M25621 Stiffness of right elbow, not elsewhere classified: Secondary | ICD-10-CM | POA: Diagnosis not present

## 2023-08-13 DIAGNOSIS — M25521 Pain in right elbow: Secondary | ICD-10-CM | POA: Diagnosis not present

## 2023-08-13 DIAGNOSIS — G8929 Other chronic pain: Secondary | ICD-10-CM | POA: Diagnosis not present

## 2023-08-13 DIAGNOSIS — Z789 Other specified health status: Secondary | ICD-10-CM | POA: Diagnosis not present

## 2023-08-13 DIAGNOSIS — S53432D Radial collateral ligament sprain of left elbow, subsequent encounter: Secondary | ICD-10-CM | POA: Diagnosis not present

## 2023-08-13 DIAGNOSIS — Z9889 Other specified postprocedural states: Secondary | ICD-10-CM | POA: Diagnosis not present

## 2023-08-13 DIAGNOSIS — S53401A Unspecified sprain of right elbow, initial encounter: Secondary | ICD-10-CM | POA: Diagnosis not present

## 2023-08-13 DIAGNOSIS — M25622 Stiffness of left elbow, not elsewhere classified: Secondary | ICD-10-CM | POA: Diagnosis not present

## 2023-08-13 DIAGNOSIS — M25522 Pain in left elbow: Secondary | ICD-10-CM | POA: Diagnosis not present

## 2023-08-13 DIAGNOSIS — S53432A Radial collateral ligament sprain of left elbow, initial encounter: Secondary | ICD-10-CM | POA: Diagnosis not present

## 2023-08-20 DIAGNOSIS — M25622 Stiffness of left elbow, not elsewhere classified: Secondary | ICD-10-CM | POA: Diagnosis not present

## 2023-08-20 DIAGNOSIS — S53401A Unspecified sprain of right elbow, initial encounter: Secondary | ICD-10-CM | POA: Diagnosis not present

## 2023-08-20 DIAGNOSIS — M25521 Pain in right elbow: Secondary | ICD-10-CM | POA: Diagnosis not present

## 2023-08-20 DIAGNOSIS — M25522 Pain in left elbow: Secondary | ICD-10-CM | POA: Diagnosis not present

## 2023-08-20 DIAGNOSIS — S53432D Radial collateral ligament sprain of left elbow, subsequent encounter: Secondary | ICD-10-CM | POA: Diagnosis not present

## 2023-08-20 DIAGNOSIS — G8929 Other chronic pain: Secondary | ICD-10-CM | POA: Diagnosis not present

## 2023-08-20 DIAGNOSIS — Z789 Other specified health status: Secondary | ICD-10-CM | POA: Diagnosis not present

## 2023-08-20 DIAGNOSIS — M25621 Stiffness of right elbow, not elsewhere classified: Secondary | ICD-10-CM | POA: Diagnosis not present

## 2023-08-20 DIAGNOSIS — S53432A Radial collateral ligament sprain of left elbow, initial encounter: Secondary | ICD-10-CM | POA: Diagnosis not present

## 2023-08-20 DIAGNOSIS — Z9889 Other specified postprocedural states: Secondary | ICD-10-CM | POA: Diagnosis not present

## 2023-08-26 DIAGNOSIS — K219 Gastro-esophageal reflux disease without esophagitis: Secondary | ICD-10-CM | POA: Diagnosis not present

## 2023-08-26 DIAGNOSIS — I10 Essential (primary) hypertension: Secondary | ICD-10-CM | POA: Diagnosis not present

## 2023-08-26 DIAGNOSIS — Z79899 Other long term (current) drug therapy: Secondary | ICD-10-CM | POA: Diagnosis not present

## 2023-08-28 DIAGNOSIS — K219 Gastro-esophageal reflux disease without esophagitis: Secondary | ICD-10-CM | POA: Diagnosis not present

## 2023-08-28 DIAGNOSIS — R1312 Dysphagia, oropharyngeal phase: Secondary | ICD-10-CM | POA: Diagnosis not present

## 2023-08-28 DIAGNOSIS — R110 Nausea: Secondary | ICD-10-CM | POA: Diagnosis not present

## 2023-08-28 DIAGNOSIS — R1010 Upper abdominal pain, unspecified: Secondary | ICD-10-CM | POA: Diagnosis not present

## 2023-08-28 DIAGNOSIS — Z9889 Other specified postprocedural states: Secondary | ICD-10-CM | POA: Diagnosis not present

## 2023-08-28 DIAGNOSIS — R1319 Other dysphagia: Secondary | ICD-10-CM | POA: Diagnosis not present

## 2023-08-29 DIAGNOSIS — M25522 Pain in left elbow: Secondary | ICD-10-CM | POA: Diagnosis not present

## 2023-08-29 DIAGNOSIS — G8929 Other chronic pain: Secondary | ICD-10-CM | POA: Diagnosis not present

## 2023-08-29 DIAGNOSIS — M25521 Pain in right elbow: Secondary | ICD-10-CM | POA: Diagnosis not present

## 2023-08-29 DIAGNOSIS — M25621 Stiffness of right elbow, not elsewhere classified: Secondary | ICD-10-CM | POA: Diagnosis not present

## 2023-08-29 DIAGNOSIS — Z9889 Other specified postprocedural states: Secondary | ICD-10-CM | POA: Diagnosis not present

## 2023-08-29 DIAGNOSIS — S53401A Unspecified sprain of right elbow, initial encounter: Secondary | ICD-10-CM | POA: Diagnosis not present

## 2023-08-29 DIAGNOSIS — Z789 Other specified health status: Secondary | ICD-10-CM | POA: Diagnosis not present

## 2023-08-29 DIAGNOSIS — M25622 Stiffness of left elbow, not elsewhere classified: Secondary | ICD-10-CM | POA: Diagnosis not present

## 2023-08-29 DIAGNOSIS — S53432D Radial collateral ligament sprain of left elbow, subsequent encounter: Secondary | ICD-10-CM | POA: Diagnosis not present

## 2023-08-29 DIAGNOSIS — S53432A Radial collateral ligament sprain of left elbow, initial encounter: Secondary | ICD-10-CM | POA: Diagnosis not present

## 2023-09-09 DIAGNOSIS — R1010 Upper abdominal pain, unspecified: Secondary | ICD-10-CM | POA: Diagnosis not present

## 2023-09-09 DIAGNOSIS — R1319 Other dysphagia: Secondary | ICD-10-CM | POA: Diagnosis not present

## 2023-09-09 DIAGNOSIS — R110 Nausea: Secondary | ICD-10-CM | POA: Diagnosis not present

## 2023-09-09 DIAGNOSIS — R1312 Dysphagia, oropharyngeal phase: Secondary | ICD-10-CM | POA: Diagnosis not present

## 2023-09-09 DIAGNOSIS — Z9889 Other specified postprocedural states: Secondary | ICD-10-CM | POA: Diagnosis not present

## 2023-09-09 DIAGNOSIS — K219 Gastro-esophageal reflux disease without esophagitis: Secondary | ICD-10-CM | POA: Diagnosis not present

## 2023-09-10 DIAGNOSIS — J3089 Other allergic rhinitis: Secondary | ICD-10-CM | POA: Diagnosis not present

## 2023-09-11 DIAGNOSIS — Z79891 Long term (current) use of opiate analgesic: Secondary | ICD-10-CM | POA: Diagnosis not present

## 2023-09-15 DIAGNOSIS — Z7982 Long term (current) use of aspirin: Secondary | ICD-10-CM | POA: Diagnosis not present

## 2023-09-15 DIAGNOSIS — Z79899 Other long term (current) drug therapy: Secondary | ICD-10-CM | POA: Diagnosis not present

## 2023-09-15 DIAGNOSIS — R1312 Dysphagia, oropharyngeal phase: Secondary | ICD-10-CM | POA: Diagnosis not present

## 2023-09-15 DIAGNOSIS — D696 Thrombocytopenia, unspecified: Secondary | ICD-10-CM | POA: Diagnosis not present

## 2023-09-15 DIAGNOSIS — I10 Essential (primary) hypertension: Secondary | ICD-10-CM | POA: Diagnosis not present

## 2023-09-15 DIAGNOSIS — R1314 Dysphagia, pharyngoesophageal phase: Secondary | ICD-10-CM | POA: Diagnosis not present

## 2023-09-15 DIAGNOSIS — R1319 Other dysphagia: Secondary | ICD-10-CM | POA: Diagnosis not present

## 2023-09-15 DIAGNOSIS — T182XXA Foreign body in stomach, initial encounter: Secondary | ICD-10-CM | POA: Diagnosis not present

## 2023-09-15 DIAGNOSIS — R569 Unspecified convulsions: Secondary | ICD-10-CM | POA: Diagnosis not present

## 2023-09-15 DIAGNOSIS — T183XXA Foreign body in small intestine, initial encounter: Secondary | ICD-10-CM | POA: Diagnosis not present

## 2023-09-15 DIAGNOSIS — Z87891 Personal history of nicotine dependence: Secondary | ICD-10-CM | POA: Diagnosis not present

## 2023-09-15 DIAGNOSIS — K219 Gastro-esophageal reflux disease without esophagitis: Secondary | ICD-10-CM | POA: Diagnosis not present

## 2023-09-15 DIAGNOSIS — R110 Nausea: Secondary | ICD-10-CM | POA: Diagnosis not present

## 2023-09-19 DIAGNOSIS — M25522 Pain in left elbow: Secondary | ICD-10-CM | POA: Diagnosis not present

## 2023-09-19 DIAGNOSIS — S53432A Radial collateral ligament sprain of left elbow, initial encounter: Secondary | ICD-10-CM | POA: Diagnosis not present

## 2023-09-19 DIAGNOSIS — M25621 Stiffness of right elbow, not elsewhere classified: Secondary | ICD-10-CM | POA: Diagnosis not present

## 2023-09-19 DIAGNOSIS — M25521 Pain in right elbow: Secondary | ICD-10-CM | POA: Diagnosis not present

## 2023-09-19 DIAGNOSIS — S53401A Unspecified sprain of right elbow, initial encounter: Secondary | ICD-10-CM | POA: Diagnosis not present

## 2023-09-19 DIAGNOSIS — G8929 Other chronic pain: Secondary | ICD-10-CM | POA: Diagnosis not present

## 2023-09-19 DIAGNOSIS — Z789 Other specified health status: Secondary | ICD-10-CM | POA: Diagnosis not present

## 2023-09-19 DIAGNOSIS — Z9889 Other specified postprocedural states: Secondary | ICD-10-CM | POA: Diagnosis not present

## 2023-09-19 DIAGNOSIS — S53432D Radial collateral ligament sprain of left elbow, subsequent encounter: Secondary | ICD-10-CM | POA: Diagnosis not present

## 2023-09-19 DIAGNOSIS — M25622 Stiffness of left elbow, not elsewhere classified: Secondary | ICD-10-CM | POA: Diagnosis not present

## 2023-10-05 DIAGNOSIS — Z20822 Contact with and (suspected) exposure to covid-19: Secondary | ICD-10-CM | POA: Diagnosis not present

## 2023-10-05 DIAGNOSIS — M542 Cervicalgia: Secondary | ICD-10-CM | POA: Diagnosis not present

## 2023-10-05 DIAGNOSIS — R059 Cough, unspecified: Secondary | ICD-10-CM | POA: Diagnosis not present

## 2023-10-05 DIAGNOSIS — R051 Acute cough: Secondary | ICD-10-CM | POA: Diagnosis not present

## 2023-10-05 DIAGNOSIS — F17210 Nicotine dependence, cigarettes, uncomplicated: Secondary | ICD-10-CM | POA: Diagnosis not present

## 2023-10-06 DIAGNOSIS — S53401A Unspecified sprain of right elbow, initial encounter: Secondary | ICD-10-CM | POA: Diagnosis not present

## 2023-10-06 DIAGNOSIS — M25622 Stiffness of left elbow, not elsewhere classified: Secondary | ICD-10-CM | POA: Diagnosis not present

## 2023-10-06 DIAGNOSIS — S53432A Radial collateral ligament sprain of left elbow, initial encounter: Secondary | ICD-10-CM | POA: Diagnosis not present

## 2023-10-06 DIAGNOSIS — Z9889 Other specified postprocedural states: Secondary | ICD-10-CM | POA: Diagnosis not present

## 2023-10-06 DIAGNOSIS — S53432D Radial collateral ligament sprain of left elbow, subsequent encounter: Secondary | ICD-10-CM | POA: Diagnosis not present

## 2023-10-06 DIAGNOSIS — M25522 Pain in left elbow: Secondary | ICD-10-CM | POA: Diagnosis not present

## 2023-10-06 DIAGNOSIS — M25621 Stiffness of right elbow, not elsewhere classified: Secondary | ICD-10-CM | POA: Diagnosis not present

## 2023-10-06 DIAGNOSIS — G8929 Other chronic pain: Secondary | ICD-10-CM | POA: Diagnosis not present

## 2023-10-06 DIAGNOSIS — M25521 Pain in right elbow: Secondary | ICD-10-CM | POA: Diagnosis not present

## 2023-10-06 DIAGNOSIS — Z789 Other specified health status: Secondary | ICD-10-CM | POA: Diagnosis not present

## 2023-10-13 DIAGNOSIS — M79641 Pain in right hand: Secondary | ICD-10-CM | POA: Diagnosis not present

## 2023-10-13 DIAGNOSIS — M79642 Pain in left hand: Secondary | ICD-10-CM | POA: Diagnosis not present

## 2023-10-16 DIAGNOSIS — D696 Thrombocytopenia, unspecified: Secondary | ICD-10-CM | POA: Diagnosis not present

## 2023-10-16 DIAGNOSIS — I10 Essential (primary) hypertension: Secondary | ICD-10-CM | POA: Diagnosis not present

## 2023-10-16 DIAGNOSIS — M75121 Complete rotator cuff tear or rupture of right shoulder, not specified as traumatic: Secondary | ICD-10-CM | POA: Diagnosis not present

## 2023-10-16 DIAGNOSIS — G4733 Obstructive sleep apnea (adult) (pediatric): Secondary | ICD-10-CM | POA: Diagnosis not present

## 2023-10-22 DIAGNOSIS — M75101 Unspecified rotator cuff tear or rupture of right shoulder, not specified as traumatic: Secondary | ICD-10-CM | POA: Diagnosis not present

## 2023-10-30 DIAGNOSIS — M75101 Unspecified rotator cuff tear or rupture of right shoulder, not specified as traumatic: Secondary | ICD-10-CM | POA: Diagnosis not present

## 2023-10-30 DIAGNOSIS — Z888 Allergy status to other drugs, medicaments and biological substances status: Secondary | ICD-10-CM | POA: Diagnosis not present

## 2023-10-30 DIAGNOSIS — M67813 Other specified disorders of tendon, right shoulder: Secondary | ICD-10-CM | POA: Diagnosis not present

## 2023-10-30 DIAGNOSIS — K219 Gastro-esophageal reflux disease without esophagitis: Secondary | ICD-10-CM | POA: Diagnosis not present

## 2023-10-30 DIAGNOSIS — M7521 Bicipital tendinitis, right shoulder: Secondary | ICD-10-CM | POA: Diagnosis not present

## 2023-10-30 DIAGNOSIS — Z882 Allergy status to sulfonamides status: Secondary | ICD-10-CM | POA: Diagnosis not present

## 2023-10-30 DIAGNOSIS — S43431A Superior glenoid labrum lesion of right shoulder, initial encounter: Secondary | ICD-10-CM | POA: Diagnosis not present

## 2023-10-30 DIAGNOSIS — I10 Essential (primary) hypertension: Secondary | ICD-10-CM | POA: Diagnosis not present

## 2023-10-30 DIAGNOSIS — G8918 Other acute postprocedural pain: Secondary | ICD-10-CM | POA: Diagnosis not present

## 2023-10-30 DIAGNOSIS — Z79899 Other long term (current) drug therapy: Secondary | ICD-10-CM | POA: Diagnosis not present

## 2023-10-30 DIAGNOSIS — G4733 Obstructive sleep apnea (adult) (pediatric): Secondary | ICD-10-CM | POA: Diagnosis not present

## 2023-10-30 DIAGNOSIS — M75121 Complete rotator cuff tear or rupture of right shoulder, not specified as traumatic: Secondary | ICD-10-CM | POA: Diagnosis not present

## 2023-10-30 DIAGNOSIS — Z881 Allergy status to other antibiotic agents status: Secondary | ICD-10-CM | POA: Diagnosis not present

## 2023-10-30 DIAGNOSIS — E785 Hyperlipidemia, unspecified: Secondary | ICD-10-CM | POA: Diagnosis not present

## 2023-10-30 DIAGNOSIS — Z87891 Personal history of nicotine dependence: Secondary | ICD-10-CM | POA: Diagnosis not present

## 2023-10-30 DIAGNOSIS — S43491A Other sprain of right shoulder joint, initial encounter: Secondary | ICD-10-CM | POA: Diagnosis not present

## 2023-11-05 DIAGNOSIS — M25511 Pain in right shoulder: Secondary | ICD-10-CM | POA: Diagnosis not present

## 2023-11-05 DIAGNOSIS — U071 COVID-19: Secondary | ICD-10-CM | POA: Diagnosis not present

## 2023-11-05 DIAGNOSIS — Z87891 Personal history of nicotine dependence: Secondary | ICD-10-CM | POA: Diagnosis not present

## 2023-11-05 DIAGNOSIS — R509 Fever, unspecified: Secondary | ICD-10-CM | POA: Diagnosis not present

## 2023-11-05 DIAGNOSIS — R059 Cough, unspecified: Secondary | ICD-10-CM | POA: Diagnosis not present

## 2023-11-05 DIAGNOSIS — M791 Myalgia, unspecified site: Secondary | ICD-10-CM | POA: Diagnosis not present

## 2023-11-14 DIAGNOSIS — M75101 Unspecified rotator cuff tear or rupture of right shoulder, not specified as traumatic: Secondary | ICD-10-CM | POA: Diagnosis not present

## 2023-11-28 DIAGNOSIS — M75121 Complete rotator cuff tear or rupture of right shoulder, not specified as traumatic: Secondary | ICD-10-CM | POA: Diagnosis not present

## 2023-12-01 DIAGNOSIS — K3184 Gastroparesis: Secondary | ICD-10-CM | POA: Diagnosis not present

## 2023-12-01 DIAGNOSIS — R112 Nausea with vomiting, unspecified: Secondary | ICD-10-CM | POA: Diagnosis not present

## 2023-12-01 DIAGNOSIS — R1312 Dysphagia, oropharyngeal phase: Secondary | ICD-10-CM | POA: Diagnosis not present

## 2023-12-01 DIAGNOSIS — R1319 Other dysphagia: Secondary | ICD-10-CM | POA: Diagnosis not present

## 2023-12-01 DIAGNOSIS — K219 Gastro-esophageal reflux disease without esophagitis: Secondary | ICD-10-CM | POA: Diagnosis not present

## 2023-12-08 DIAGNOSIS — M25611 Stiffness of right shoulder, not elsewhere classified: Secondary | ICD-10-CM | POA: Diagnosis not present

## 2023-12-08 DIAGNOSIS — M25521 Pain in right elbow: Secondary | ICD-10-CM | POA: Diagnosis not present

## 2023-12-08 DIAGNOSIS — S53432D Radial collateral ligament sprain of left elbow, subsequent encounter: Secondary | ICD-10-CM | POA: Diagnosis not present

## 2023-12-08 DIAGNOSIS — M25621 Stiffness of right elbow, not elsewhere classified: Secondary | ICD-10-CM | POA: Diagnosis not present

## 2023-12-08 DIAGNOSIS — Z9889 Other specified postprocedural states: Secondary | ICD-10-CM | POA: Diagnosis not present

## 2023-12-08 DIAGNOSIS — M75101 Unspecified rotator cuff tear or rupture of right shoulder, not specified as traumatic: Secondary | ICD-10-CM | POA: Diagnosis not present

## 2023-12-08 DIAGNOSIS — S53401A Unspecified sprain of right elbow, initial encounter: Secondary | ICD-10-CM | POA: Diagnosis not present

## 2023-12-08 DIAGNOSIS — G8929 Other chronic pain: Secondary | ICD-10-CM | POA: Diagnosis not present

## 2023-12-08 DIAGNOSIS — M25522 Pain in left elbow: Secondary | ICD-10-CM | POA: Diagnosis not present

## 2023-12-08 DIAGNOSIS — R29898 Other symptoms and signs involving the musculoskeletal system: Secondary | ICD-10-CM | POA: Diagnosis not present

## 2023-12-08 DIAGNOSIS — Z789 Other specified health status: Secondary | ICD-10-CM | POA: Diagnosis not present

## 2023-12-08 DIAGNOSIS — S53432A Radial collateral ligament sprain of left elbow, initial encounter: Secondary | ICD-10-CM | POA: Diagnosis not present

## 2023-12-08 DIAGNOSIS — M25622 Stiffness of left elbow, not elsewhere classified: Secondary | ICD-10-CM | POA: Diagnosis not present

## 2023-12-09 DIAGNOSIS — Z79899 Other long term (current) drug therapy: Secondary | ICD-10-CM | POA: Diagnosis not present

## 2023-12-09 DIAGNOSIS — Z87891 Personal history of nicotine dependence: Secondary | ICD-10-CM | POA: Diagnosis not present

## 2023-12-09 DIAGNOSIS — I10 Essential (primary) hypertension: Secondary | ICD-10-CM | POA: Diagnosis not present

## 2023-12-12 DIAGNOSIS — K219 Gastro-esophageal reflux disease without esophagitis: Secondary | ICD-10-CM | POA: Diagnosis not present

## 2023-12-12 DIAGNOSIS — R1310 Dysphagia, unspecified: Secondary | ICD-10-CM | POA: Diagnosis not present

## 2023-12-18 DIAGNOSIS — M25522 Pain in left elbow: Secondary | ICD-10-CM | POA: Diagnosis not present

## 2023-12-18 DIAGNOSIS — Z9889 Other specified postprocedural states: Secondary | ICD-10-CM | POA: Diagnosis not present

## 2023-12-18 DIAGNOSIS — M75101 Unspecified rotator cuff tear or rupture of right shoulder, not specified as traumatic: Secondary | ICD-10-CM | POA: Diagnosis not present

## 2023-12-18 DIAGNOSIS — R29898 Other symptoms and signs involving the musculoskeletal system: Secondary | ICD-10-CM | POA: Diagnosis not present

## 2023-12-18 DIAGNOSIS — S53432A Radial collateral ligament sprain of left elbow, initial encounter: Secondary | ICD-10-CM | POA: Diagnosis not present

## 2023-12-18 DIAGNOSIS — M25611 Stiffness of right shoulder, not elsewhere classified: Secondary | ICD-10-CM | POA: Diagnosis not present

## 2023-12-18 DIAGNOSIS — M25622 Stiffness of left elbow, not elsewhere classified: Secondary | ICD-10-CM | POA: Diagnosis not present

## 2023-12-18 DIAGNOSIS — G8929 Other chronic pain: Secondary | ICD-10-CM | POA: Diagnosis not present

## 2023-12-18 DIAGNOSIS — S53401A Unspecified sprain of right elbow, initial encounter: Secondary | ICD-10-CM | POA: Diagnosis not present

## 2023-12-18 DIAGNOSIS — M25621 Stiffness of right elbow, not elsewhere classified: Secondary | ICD-10-CM | POA: Diagnosis not present

## 2023-12-18 DIAGNOSIS — M25521 Pain in right elbow: Secondary | ICD-10-CM | POA: Diagnosis not present

## 2023-12-18 DIAGNOSIS — S53432D Radial collateral ligament sprain of left elbow, subsequent encounter: Secondary | ICD-10-CM | POA: Diagnosis not present

## 2023-12-18 DIAGNOSIS — Z789 Other specified health status: Secondary | ICD-10-CM | POA: Diagnosis not present

## 2023-12-22 DIAGNOSIS — M19041 Primary osteoarthritis, right hand: Secondary | ICD-10-CM | POA: Diagnosis not present

## 2023-12-22 DIAGNOSIS — M1811 Unilateral primary osteoarthritis of first carpometacarpal joint, right hand: Secondary | ICD-10-CM | POA: Diagnosis not present

## 2023-12-22 DIAGNOSIS — M79641 Pain in right hand: Secondary | ICD-10-CM | POA: Diagnosis not present

## 2023-12-22 DIAGNOSIS — M19031 Primary osteoarthritis, right wrist: Secondary | ICD-10-CM | POA: Diagnosis not present

## 2023-12-23 DIAGNOSIS — S53401A Unspecified sprain of right elbow, initial encounter: Secondary | ICD-10-CM | POA: Diagnosis not present

## 2023-12-23 DIAGNOSIS — M25621 Stiffness of right elbow, not elsewhere classified: Secondary | ICD-10-CM | POA: Diagnosis not present

## 2023-12-23 DIAGNOSIS — M25622 Stiffness of left elbow, not elsewhere classified: Secondary | ICD-10-CM | POA: Diagnosis not present

## 2023-12-23 DIAGNOSIS — G8929 Other chronic pain: Secondary | ICD-10-CM | POA: Diagnosis not present

## 2023-12-23 DIAGNOSIS — M25522 Pain in left elbow: Secondary | ICD-10-CM | POA: Diagnosis not present

## 2023-12-23 DIAGNOSIS — M25521 Pain in right elbow: Secondary | ICD-10-CM | POA: Diagnosis not present

## 2023-12-23 DIAGNOSIS — S53432D Radial collateral ligament sprain of left elbow, subsequent encounter: Secondary | ICD-10-CM | POA: Diagnosis not present

## 2023-12-23 DIAGNOSIS — Z789 Other specified health status: Secondary | ICD-10-CM | POA: Diagnosis not present

## 2023-12-23 DIAGNOSIS — S53432A Radial collateral ligament sprain of left elbow, initial encounter: Secondary | ICD-10-CM | POA: Diagnosis not present

## 2023-12-23 DIAGNOSIS — M25611 Stiffness of right shoulder, not elsewhere classified: Secondary | ICD-10-CM | POA: Diagnosis not present

## 2023-12-23 DIAGNOSIS — R29898 Other symptoms and signs involving the musculoskeletal system: Secondary | ICD-10-CM | POA: Diagnosis not present

## 2023-12-23 DIAGNOSIS — Z9889 Other specified postprocedural states: Secondary | ICD-10-CM | POA: Diagnosis not present

## 2023-12-23 DIAGNOSIS — M75101 Unspecified rotator cuff tear or rupture of right shoulder, not specified as traumatic: Secondary | ICD-10-CM | POA: Diagnosis not present

## 2023-12-25 DIAGNOSIS — G8929 Other chronic pain: Secondary | ICD-10-CM | POA: Diagnosis not present

## 2023-12-25 DIAGNOSIS — S53432D Radial collateral ligament sprain of left elbow, subsequent encounter: Secondary | ICD-10-CM | POA: Diagnosis not present

## 2023-12-25 DIAGNOSIS — S53432A Radial collateral ligament sprain of left elbow, initial encounter: Secondary | ICD-10-CM | POA: Diagnosis not present

## 2023-12-25 DIAGNOSIS — M25621 Stiffness of right elbow, not elsewhere classified: Secondary | ICD-10-CM | POA: Diagnosis not present

## 2023-12-25 DIAGNOSIS — S53401A Unspecified sprain of right elbow, initial encounter: Secondary | ICD-10-CM | POA: Diagnosis not present

## 2023-12-25 DIAGNOSIS — M75101 Unspecified rotator cuff tear or rupture of right shoulder, not specified as traumatic: Secondary | ICD-10-CM | POA: Diagnosis not present

## 2023-12-25 DIAGNOSIS — M25611 Stiffness of right shoulder, not elsewhere classified: Secondary | ICD-10-CM | POA: Diagnosis not present

## 2023-12-25 DIAGNOSIS — Z9889 Other specified postprocedural states: Secondary | ICD-10-CM | POA: Diagnosis not present

## 2023-12-25 DIAGNOSIS — R29898 Other symptoms and signs involving the musculoskeletal system: Secondary | ICD-10-CM | POA: Diagnosis not present

## 2023-12-25 DIAGNOSIS — M25622 Stiffness of left elbow, not elsewhere classified: Secondary | ICD-10-CM | POA: Diagnosis not present

## 2023-12-25 DIAGNOSIS — M25522 Pain in left elbow: Secondary | ICD-10-CM | POA: Diagnosis not present

## 2023-12-25 DIAGNOSIS — M25521 Pain in right elbow: Secondary | ICD-10-CM | POA: Diagnosis not present

## 2023-12-25 DIAGNOSIS — Z789 Other specified health status: Secondary | ICD-10-CM | POA: Diagnosis not present

## 2023-12-26 DIAGNOSIS — D696 Thrombocytopenia, unspecified: Secondary | ICD-10-CM | POA: Diagnosis not present

## 2023-12-26 DIAGNOSIS — D709 Neutropenia, unspecified: Secondary | ICD-10-CM | POA: Diagnosis not present

## 2023-12-29 DIAGNOSIS — M25522 Pain in left elbow: Secondary | ICD-10-CM | POA: Diagnosis not present

## 2023-12-29 DIAGNOSIS — S53401A Unspecified sprain of right elbow, initial encounter: Secondary | ICD-10-CM | POA: Diagnosis not present

## 2023-12-29 DIAGNOSIS — Z9889 Other specified postprocedural states: Secondary | ICD-10-CM | POA: Diagnosis not present

## 2023-12-29 DIAGNOSIS — M25621 Stiffness of right elbow, not elsewhere classified: Secondary | ICD-10-CM | POA: Diagnosis not present

## 2023-12-29 DIAGNOSIS — M25521 Pain in right elbow: Secondary | ICD-10-CM | POA: Diagnosis not present

## 2023-12-29 DIAGNOSIS — S53432A Radial collateral ligament sprain of left elbow, initial encounter: Secondary | ICD-10-CM | POA: Diagnosis not present

## 2023-12-29 DIAGNOSIS — R29898 Other symptoms and signs involving the musculoskeletal system: Secondary | ICD-10-CM | POA: Diagnosis not present

## 2023-12-29 DIAGNOSIS — Z789 Other specified health status: Secondary | ICD-10-CM | POA: Diagnosis not present

## 2023-12-29 DIAGNOSIS — G8929 Other chronic pain: Secondary | ICD-10-CM | POA: Diagnosis not present

## 2023-12-29 DIAGNOSIS — M25622 Stiffness of left elbow, not elsewhere classified: Secondary | ICD-10-CM | POA: Diagnosis not present

## 2023-12-29 DIAGNOSIS — S53432D Radial collateral ligament sprain of left elbow, subsequent encounter: Secondary | ICD-10-CM | POA: Diagnosis not present

## 2023-12-29 DIAGNOSIS — M75101 Unspecified rotator cuff tear or rupture of right shoulder, not specified as traumatic: Secondary | ICD-10-CM | POA: Diagnosis not present

## 2023-12-29 DIAGNOSIS — M25611 Stiffness of right shoulder, not elsewhere classified: Secondary | ICD-10-CM | POA: Diagnosis not present

## 2023-12-30 DIAGNOSIS — M47812 Spondylosis without myelopathy or radiculopathy, cervical region: Secondary | ICD-10-CM | POA: Diagnosis not present

## 2023-12-30 DIAGNOSIS — Z981 Arthrodesis status: Secondary | ICD-10-CM | POA: Diagnosis not present

## 2023-12-30 DIAGNOSIS — M542 Cervicalgia: Secondary | ICD-10-CM | POA: Diagnosis not present

## 2023-12-31 DIAGNOSIS — S53432D Radial collateral ligament sprain of left elbow, subsequent encounter: Secondary | ICD-10-CM | POA: Diagnosis not present

## 2023-12-31 DIAGNOSIS — R29898 Other symptoms and signs involving the musculoskeletal system: Secondary | ICD-10-CM | POA: Diagnosis not present

## 2023-12-31 DIAGNOSIS — Z789 Other specified health status: Secondary | ICD-10-CM | POA: Diagnosis not present

## 2023-12-31 DIAGNOSIS — M25522 Pain in left elbow: Secondary | ICD-10-CM | POA: Diagnosis not present

## 2023-12-31 DIAGNOSIS — S53401A Unspecified sprain of right elbow, initial encounter: Secondary | ICD-10-CM | POA: Diagnosis not present

## 2023-12-31 DIAGNOSIS — M25621 Stiffness of right elbow, not elsewhere classified: Secondary | ICD-10-CM | POA: Diagnosis not present

## 2023-12-31 DIAGNOSIS — G8929 Other chronic pain: Secondary | ICD-10-CM | POA: Diagnosis not present

## 2023-12-31 DIAGNOSIS — M75101 Unspecified rotator cuff tear or rupture of right shoulder, not specified as traumatic: Secondary | ICD-10-CM | POA: Diagnosis not present

## 2023-12-31 DIAGNOSIS — Z9889 Other specified postprocedural states: Secondary | ICD-10-CM | POA: Diagnosis not present

## 2023-12-31 DIAGNOSIS — M75121 Complete rotator cuff tear or rupture of right shoulder, not specified as traumatic: Secondary | ICD-10-CM | POA: Diagnosis not present

## 2023-12-31 DIAGNOSIS — M25622 Stiffness of left elbow, not elsewhere classified: Secondary | ICD-10-CM | POA: Diagnosis not present

## 2023-12-31 DIAGNOSIS — M25611 Stiffness of right shoulder, not elsewhere classified: Secondary | ICD-10-CM | POA: Diagnosis not present

## 2023-12-31 DIAGNOSIS — S53432A Radial collateral ligament sprain of left elbow, initial encounter: Secondary | ICD-10-CM | POA: Diagnosis not present

## 2023-12-31 DIAGNOSIS — M25521 Pain in right elbow: Secondary | ICD-10-CM | POA: Diagnosis not present

## 2024-01-02 DIAGNOSIS — Z9181 History of falling: Secondary | ICD-10-CM | POA: Diagnosis not present

## 2024-01-02 DIAGNOSIS — Z0000 Encounter for general adult medical examination without abnormal findings: Secondary | ICD-10-CM | POA: Diagnosis not present

## 2024-01-05 DIAGNOSIS — M75101 Unspecified rotator cuff tear or rupture of right shoulder, not specified as traumatic: Secondary | ICD-10-CM | POA: Diagnosis not present

## 2024-01-05 DIAGNOSIS — S53432D Radial collateral ligament sprain of left elbow, subsequent encounter: Secondary | ICD-10-CM | POA: Diagnosis not present

## 2024-01-05 DIAGNOSIS — Z9889 Other specified postprocedural states: Secondary | ICD-10-CM | POA: Diagnosis not present

## 2024-01-05 DIAGNOSIS — M25611 Stiffness of right shoulder, not elsewhere classified: Secondary | ICD-10-CM | POA: Diagnosis not present

## 2024-01-05 DIAGNOSIS — R29898 Other symptoms and signs involving the musculoskeletal system: Secondary | ICD-10-CM | POA: Diagnosis not present

## 2024-01-05 DIAGNOSIS — M25622 Stiffness of left elbow, not elsewhere classified: Secondary | ICD-10-CM | POA: Diagnosis not present

## 2024-01-05 DIAGNOSIS — G8929 Other chronic pain: Secondary | ICD-10-CM | POA: Diagnosis not present

## 2024-01-05 DIAGNOSIS — M25621 Stiffness of right elbow, not elsewhere classified: Secondary | ICD-10-CM | POA: Diagnosis not present

## 2024-01-05 DIAGNOSIS — M25522 Pain in left elbow: Secondary | ICD-10-CM | POA: Diagnosis not present

## 2024-01-05 DIAGNOSIS — Z789 Other specified health status: Secondary | ICD-10-CM | POA: Diagnosis not present

## 2024-01-05 DIAGNOSIS — S53401A Unspecified sprain of right elbow, initial encounter: Secondary | ICD-10-CM | POA: Diagnosis not present

## 2024-01-05 DIAGNOSIS — S53432A Radial collateral ligament sprain of left elbow, initial encounter: Secondary | ICD-10-CM | POA: Diagnosis not present

## 2024-01-05 DIAGNOSIS — M25521 Pain in right elbow: Secondary | ICD-10-CM | POA: Diagnosis not present

## 2024-01-11 DIAGNOSIS — M5021 Other cervical disc displacement,  high cervical region: Secondary | ICD-10-CM | POA: Diagnosis not present

## 2024-01-11 DIAGNOSIS — M47812 Spondylosis without myelopathy or radiculopathy, cervical region: Secondary | ICD-10-CM | POA: Diagnosis not present

## 2024-01-11 DIAGNOSIS — M5031 Other cervical disc degeneration,  high cervical region: Secondary | ICD-10-CM | POA: Diagnosis not present

## 2024-01-11 DIAGNOSIS — M4802 Spinal stenosis, cervical region: Secondary | ICD-10-CM | POA: Diagnosis not present

## 2024-01-12 DIAGNOSIS — M65341 Trigger finger, right ring finger: Secondary | ICD-10-CM | POA: Diagnosis not present

## 2024-01-12 DIAGNOSIS — J3089 Other allergic rhinitis: Secondary | ICD-10-CM | POA: Diagnosis not present

## 2024-01-12 DIAGNOSIS — M1811 Unilateral primary osteoarthritis of first carpometacarpal joint, right hand: Secondary | ICD-10-CM | POA: Diagnosis not present

## 2024-01-13 DIAGNOSIS — J3089 Other allergic rhinitis: Secondary | ICD-10-CM | POA: Diagnosis not present

## 2024-01-15 DIAGNOSIS — F17210 Nicotine dependence, cigarettes, uncomplicated: Secondary | ICD-10-CM | POA: Diagnosis not present

## 2024-01-19 DIAGNOSIS — M25622 Stiffness of left elbow, not elsewhere classified: Secondary | ICD-10-CM | POA: Diagnosis not present

## 2024-01-19 DIAGNOSIS — M75101 Unspecified rotator cuff tear or rupture of right shoulder, not specified as traumatic: Secondary | ICD-10-CM | POA: Diagnosis not present

## 2024-01-19 DIAGNOSIS — Z9889 Other specified postprocedural states: Secondary | ICD-10-CM | POA: Diagnosis not present

## 2024-01-19 DIAGNOSIS — M25522 Pain in left elbow: Secondary | ICD-10-CM | POA: Diagnosis not present

## 2024-01-19 DIAGNOSIS — S53401D Unspecified sprain of right elbow, subsequent encounter: Secondary | ICD-10-CM | POA: Diagnosis not present

## 2024-01-19 DIAGNOSIS — M25521 Pain in right elbow: Secondary | ICD-10-CM | POA: Diagnosis not present

## 2024-01-19 DIAGNOSIS — S53432D Radial collateral ligament sprain of left elbow, subsequent encounter: Secondary | ICD-10-CM | POA: Diagnosis not present

## 2024-01-19 DIAGNOSIS — Z789 Other specified health status: Secondary | ICD-10-CM | POA: Diagnosis not present

## 2024-01-19 DIAGNOSIS — M25611 Stiffness of right shoulder, not elsewhere classified: Secondary | ICD-10-CM | POA: Diagnosis not present

## 2024-01-19 DIAGNOSIS — R29898 Other symptoms and signs involving the musculoskeletal system: Secondary | ICD-10-CM | POA: Diagnosis not present

## 2024-01-19 DIAGNOSIS — G8929 Other chronic pain: Secondary | ICD-10-CM | POA: Diagnosis not present

## 2024-01-19 DIAGNOSIS — M25621 Stiffness of right elbow, not elsewhere classified: Secondary | ICD-10-CM | POA: Diagnosis not present

## 2024-01-28 DIAGNOSIS — M75101 Unspecified rotator cuff tear or rupture of right shoulder, not specified as traumatic: Secondary | ICD-10-CM | POA: Diagnosis not present

## 2024-01-28 DIAGNOSIS — M170 Bilateral primary osteoarthritis of knee: Secondary | ICD-10-CM | POA: Diagnosis not present

## 2024-02-06 DIAGNOSIS — M1711 Unilateral primary osteoarthritis, right knee: Secondary | ICD-10-CM | POA: Diagnosis not present

## 2024-02-06 DIAGNOSIS — M1712 Unilateral primary osteoarthritis, left knee: Secondary | ICD-10-CM | POA: Diagnosis not present

## 2024-02-19 DIAGNOSIS — M4802 Spinal stenosis, cervical region: Secondary | ICD-10-CM | POA: Diagnosis not present

## 2024-02-19 DIAGNOSIS — S01111A Laceration without foreign body of right eyelid and periocular area, initial encounter: Secondary | ICD-10-CM | POA: Diagnosis not present

## 2024-02-19 DIAGNOSIS — S0083XA Contusion of other part of head, initial encounter: Secondary | ICD-10-CM | POA: Diagnosis not present

## 2024-02-19 DIAGNOSIS — F17210 Nicotine dependence, cigarettes, uncomplicated: Secondary | ICD-10-CM | POA: Diagnosis not present

## 2024-02-19 DIAGNOSIS — R55 Syncope and collapse: Secondary | ICD-10-CM | POA: Diagnosis not present

## 2024-02-19 DIAGNOSIS — S01112A Laceration without foreign body of left eyelid and periocular area, initial encounter: Secondary | ICD-10-CM | POA: Diagnosis not present

## 2024-02-19 DIAGNOSIS — M25511 Pain in right shoulder: Secondary | ICD-10-CM | POA: Diagnosis not present

## 2024-02-19 DIAGNOSIS — M19011 Primary osteoarthritis, right shoulder: Secondary | ICD-10-CM | POA: Diagnosis not present

## 2024-02-19 DIAGNOSIS — R410 Disorientation, unspecified: Secondary | ICD-10-CM | POA: Diagnosis not present

## 2024-02-19 DIAGNOSIS — R9431 Abnormal electrocardiogram [ECG] [EKG]: Secondary | ICD-10-CM | POA: Diagnosis not present

## 2024-02-19 DIAGNOSIS — Z981 Arthrodesis status: Secondary | ICD-10-CM | POA: Diagnosis not present

## 2024-02-20 DIAGNOSIS — R55 Syncope and collapse: Secondary | ICD-10-CM | POA: Diagnosis not present

## 2024-02-20 DIAGNOSIS — S01111A Laceration without foreign body of right eyelid and periocular area, initial encounter: Secondary | ICD-10-CM | POA: Diagnosis not present

## 2024-02-20 DIAGNOSIS — I517 Cardiomegaly: Secondary | ICD-10-CM | POA: Diagnosis not present

## 2024-02-21 DIAGNOSIS — I440 Atrioventricular block, first degree: Secondary | ICD-10-CM | POA: Diagnosis not present

## 2024-02-21 DIAGNOSIS — R001 Bradycardia, unspecified: Secondary | ICD-10-CM | POA: Diagnosis not present

## 2024-02-24 DIAGNOSIS — M65341 Trigger finger, right ring finger: Secondary | ICD-10-CM | POA: Diagnosis not present

## 2024-02-24 DIAGNOSIS — M1811 Unilateral primary osteoarthritis of first carpometacarpal joint, right hand: Secondary | ICD-10-CM | POA: Diagnosis not present

## 2024-02-25 DIAGNOSIS — I10 Essential (primary) hypertension: Secondary | ICD-10-CM | POA: Diagnosis not present

## 2024-02-25 DIAGNOSIS — S0191XA Laceration without foreign body of unspecified part of head, initial encounter: Secondary | ICD-10-CM | POA: Diagnosis not present

## 2024-02-25 DIAGNOSIS — R55 Syncope and collapse: Secondary | ICD-10-CM | POA: Diagnosis not present

## 2024-02-27 DIAGNOSIS — R55 Syncope and collapse: Secondary | ICD-10-CM | POA: Diagnosis not present

## 2024-03-08 DIAGNOSIS — K219 Gastro-esophageal reflux disease without esophagitis: Secondary | ICD-10-CM | POA: Diagnosis not present

## 2024-03-08 DIAGNOSIS — Z79899 Other long term (current) drug therapy: Secondary | ICD-10-CM | POA: Diagnosis not present

## 2024-03-08 DIAGNOSIS — I10 Essential (primary) hypertension: Secondary | ICD-10-CM | POA: Diagnosis not present

## 2024-03-09 DIAGNOSIS — G4486 Cervicogenic headache: Secondary | ICD-10-CM | POA: Diagnosis not present

## 2024-03-09 DIAGNOSIS — M5030 Other cervical disc degeneration, unspecified cervical region: Secondary | ICD-10-CM | POA: Diagnosis not present

## 2024-03-25 DIAGNOSIS — G894 Chronic pain syndrome: Secondary | ICD-10-CM | POA: Diagnosis not present

## 2024-03-25 DIAGNOSIS — M961 Postlaminectomy syndrome, not elsewhere classified: Secondary | ICD-10-CM | POA: Diagnosis not present

## 2024-03-25 DIAGNOSIS — M533 Sacrococcygeal disorders, not elsewhere classified: Secondary | ICD-10-CM | POA: Diagnosis not present

## 2024-03-25 DIAGNOSIS — Z981 Arthrodesis status: Secondary | ICD-10-CM | POA: Diagnosis not present

## 2024-03-25 DIAGNOSIS — M5416 Radiculopathy, lumbar region: Secondary | ICD-10-CM | POA: Diagnosis not present

## 2024-03-25 DIAGNOSIS — M47812 Spondylosis without myelopathy or radiculopathy, cervical region: Secondary | ICD-10-CM | POA: Diagnosis not present

## 2024-03-25 DIAGNOSIS — M5412 Radiculopathy, cervical region: Secondary | ICD-10-CM | POA: Diagnosis not present

## 2024-04-10 DIAGNOSIS — Z23 Encounter for immunization: Secondary | ICD-10-CM | POA: Diagnosis not present

## 2024-04-10 DIAGNOSIS — S81811A Laceration without foreign body, right lower leg, initial encounter: Secondary | ICD-10-CM | POA: Diagnosis not present

## 2024-04-12 DIAGNOSIS — M79671 Pain in right foot: Secondary | ICD-10-CM | POA: Diagnosis not present

## 2024-04-12 DIAGNOSIS — M79672 Pain in left foot: Secondary | ICD-10-CM | POA: Diagnosis not present

## 2024-04-12 DIAGNOSIS — R200 Anesthesia of skin: Secondary | ICD-10-CM | POA: Diagnosis not present

## 2024-04-14 DIAGNOSIS — M25472 Effusion, left ankle: Secondary | ICD-10-CM | POA: Diagnosis not present

## 2024-04-14 DIAGNOSIS — M25471 Effusion, right ankle: Secondary | ICD-10-CM | POA: Diagnosis not present

## 2024-04-14 DIAGNOSIS — S81812S Laceration without foreign body, left lower leg, sequela: Secondary | ICD-10-CM | POA: Diagnosis not present

## 2024-04-22 DIAGNOSIS — G5602 Carpal tunnel syndrome, left upper limb: Secondary | ICD-10-CM | POA: Diagnosis not present

## 2024-04-22 DIAGNOSIS — L03115 Cellulitis of right lower limb: Secondary | ICD-10-CM | POA: Diagnosis not present

## 2024-04-22 DIAGNOSIS — Z79891 Long term (current) use of opiate analgesic: Secondary | ICD-10-CM | POA: Diagnosis not present

## 2024-04-22 DIAGNOSIS — Z79899 Other long term (current) drug therapy: Secondary | ICD-10-CM | POA: Diagnosis not present

## 2024-04-22 DIAGNOSIS — Z881 Allergy status to other antibiotic agents status: Secondary | ICD-10-CM | POA: Diagnosis not present

## 2024-04-22 DIAGNOSIS — F17210 Nicotine dependence, cigarettes, uncomplicated: Secondary | ICD-10-CM | POA: Diagnosis not present

## 2024-04-22 DIAGNOSIS — G8929 Other chronic pain: Secondary | ICD-10-CM | POA: Diagnosis not present

## 2024-04-29 DIAGNOSIS — R200 Anesthesia of skin: Secondary | ICD-10-CM | POA: Diagnosis not present

## 2024-04-29 DIAGNOSIS — M5412 Radiculopathy, cervical region: Secondary | ICD-10-CM | POA: Diagnosis not present

## 2024-04-29 DIAGNOSIS — G5602 Carpal tunnel syndrome, left upper limb: Secondary | ICD-10-CM | POA: Diagnosis not present

## 2024-04-29 DIAGNOSIS — M1811 Unilateral primary osteoarthritis of first carpometacarpal joint, right hand: Secondary | ICD-10-CM | POA: Diagnosis not present

## 2024-04-29 DIAGNOSIS — R202 Paresthesia of skin: Secondary | ICD-10-CM | POA: Diagnosis not present

## 2024-05-04 DIAGNOSIS — R200 Anesthesia of skin: Secondary | ICD-10-CM | POA: Diagnosis not present

## 2024-05-04 DIAGNOSIS — M5030 Other cervical disc degeneration, unspecified cervical region: Secondary | ICD-10-CM | POA: Diagnosis not present

## 2024-05-04 DIAGNOSIS — R202 Paresthesia of skin: Secondary | ICD-10-CM | POA: Diagnosis not present

## 2024-05-04 DIAGNOSIS — M62830 Muscle spasm of back: Secondary | ICD-10-CM | POA: Diagnosis not present

## 2024-05-07 DIAGNOSIS — Z5181 Encounter for therapeutic drug level monitoring: Secondary | ICD-10-CM | POA: Diagnosis not present

## 2024-05-07 DIAGNOSIS — W1830XA Fall on same level, unspecified, initial encounter: Secondary | ICD-10-CM | POA: Diagnosis not present

## 2024-05-07 DIAGNOSIS — M25511 Pain in right shoulder: Secondary | ICD-10-CM | POA: Diagnosis not present

## 2024-05-10 DIAGNOSIS — R208 Other disturbances of skin sensation: Secondary | ICD-10-CM | POA: Diagnosis not present

## 2024-05-24 DIAGNOSIS — M47812 Spondylosis without myelopathy or radiculopathy, cervical region: Secondary | ICD-10-CM | POA: Diagnosis not present

## 2024-05-26 DIAGNOSIS — R569 Unspecified convulsions: Secondary | ICD-10-CM | POA: Diagnosis not present

## 2024-05-28 DIAGNOSIS — M961 Postlaminectomy syndrome, not elsewhere classified: Secondary | ICD-10-CM | POA: Diagnosis not present

## 2024-05-28 DIAGNOSIS — M47812 Spondylosis without myelopathy or radiculopathy, cervical region: Secondary | ICD-10-CM | POA: Diagnosis not present

## 2024-05-28 DIAGNOSIS — Z981 Arthrodesis status: Secondary | ICD-10-CM | POA: Diagnosis not present

## 2024-05-28 DIAGNOSIS — M533 Sacrococcygeal disorders, not elsewhere classified: Secondary | ICD-10-CM | POA: Diagnosis not present

## 2024-05-28 DIAGNOSIS — G894 Chronic pain syndrome: Secondary | ICD-10-CM | POA: Diagnosis not present

## 2024-05-28 DIAGNOSIS — M5412 Radiculopathy, cervical region: Secondary | ICD-10-CM | POA: Diagnosis not present

## 2024-05-28 DIAGNOSIS — M5416 Radiculopathy, lumbar region: Secondary | ICD-10-CM | POA: Diagnosis not present

## 2024-05-31 DIAGNOSIS — G4710 Hypersomnia, unspecified: Secondary | ICD-10-CM | POA: Diagnosis not present

## 2024-05-31 DIAGNOSIS — J3089 Other allergic rhinitis: Secondary | ICD-10-CM | POA: Diagnosis not present

## 2024-05-31 DIAGNOSIS — L819 Disorder of pigmentation, unspecified: Secondary | ICD-10-CM | POA: Diagnosis not present

## 2024-05-31 DIAGNOSIS — R739 Hyperglycemia, unspecified: Secondary | ICD-10-CM | POA: Diagnosis not present

## 2024-06-01 DIAGNOSIS — J3089 Other allergic rhinitis: Secondary | ICD-10-CM | POA: Diagnosis not present

## 2024-06-08 DIAGNOSIS — Z87891 Personal history of nicotine dependence: Secondary | ICD-10-CM | POA: Diagnosis not present

## 2024-06-08 DIAGNOSIS — G4733 Obstructive sleep apnea (adult) (pediatric): Secondary | ICD-10-CM | POA: Diagnosis not present

## 2024-06-08 DIAGNOSIS — R5383 Other fatigue: Secondary | ICD-10-CM | POA: Diagnosis not present

## 2024-06-08 DIAGNOSIS — J301 Allergic rhinitis due to pollen: Secondary | ICD-10-CM | POA: Diagnosis not present

## 2024-06-10 DIAGNOSIS — M47812 Spondylosis without myelopathy or radiculopathy, cervical region: Secondary | ICD-10-CM | POA: Diagnosis not present

## 2024-06-10 DIAGNOSIS — M4802 Spinal stenosis, cervical region: Secondary | ICD-10-CM | POA: Diagnosis not present

## 2024-06-14 DIAGNOSIS — R55 Syncope and collapse: Secondary | ICD-10-CM | POA: Diagnosis not present

## 2024-06-14 DIAGNOSIS — R9401 Abnormal electroencephalogram [EEG]: Secondary | ICD-10-CM | POA: Diagnosis not present

## 2024-06-14 DIAGNOSIS — Z79899 Other long term (current) drug therapy: Secondary | ICD-10-CM | POA: Diagnosis not present

## 2024-06-16 DIAGNOSIS — R739 Hyperglycemia, unspecified: Secondary | ICD-10-CM | POA: Diagnosis not present

## 2024-06-16 DIAGNOSIS — I10 Essential (primary) hypertension: Secondary | ICD-10-CM | POA: Diagnosis not present

## 2024-06-16 DIAGNOSIS — Z79899 Other long term (current) drug therapy: Secondary | ICD-10-CM | POA: Diagnosis not present

## 2024-06-16 DIAGNOSIS — E785 Hyperlipidemia, unspecified: Secondary | ICD-10-CM | POA: Diagnosis not present

## 2024-07-02 DIAGNOSIS — M7918 Myalgia, other site: Secondary | ICD-10-CM | POA: Diagnosis not present

## 2024-07-05 DIAGNOSIS — S4351XA Sprain of right acromioclavicular joint, initial encounter: Secondary | ICD-10-CM | POA: Diagnosis not present

## 2024-07-05 DIAGNOSIS — M75102 Unspecified rotator cuff tear or rupture of left shoulder, not specified as traumatic: Secondary | ICD-10-CM | POA: Diagnosis not present

## 2024-07-06 DIAGNOSIS — I10 Essential (primary) hypertension: Secondary | ICD-10-CM | POA: Diagnosis not present

## 2024-07-06 DIAGNOSIS — J029 Acute pharyngitis, unspecified: Secondary | ICD-10-CM | POA: Diagnosis not present

## 2024-07-09 DIAGNOSIS — M75102 Unspecified rotator cuff tear or rupture of left shoulder, not specified as traumatic: Secondary | ICD-10-CM | POA: Diagnosis not present

## 2024-07-09 DIAGNOSIS — M19012 Primary osteoarthritis, left shoulder: Secondary | ICD-10-CM | POA: Diagnosis not present

## 2024-07-09 DIAGNOSIS — S46012A Strain of muscle(s) and tendon(s) of the rotator cuff of left shoulder, initial encounter: Secondary | ICD-10-CM | POA: Diagnosis not present

## 2024-07-14 DIAGNOSIS — G4733 Obstructive sleep apnea (adult) (pediatric): Secondary | ICD-10-CM | POA: Diagnosis not present

## 2024-07-26 DIAGNOSIS — G4733 Obstructive sleep apnea (adult) (pediatric): Secondary | ICD-10-CM | POA: Diagnosis not present

## 2024-07-28 DIAGNOSIS — G4730 Sleep apnea, unspecified: Secondary | ICD-10-CM | POA: Diagnosis not present

## 2024-07-28 DIAGNOSIS — K219 Gastro-esophageal reflux disease without esophagitis: Secondary | ICD-10-CM | POA: Diagnosis not present

## 2024-07-28 DIAGNOSIS — M47812 Spondylosis without myelopathy or radiculopathy, cervical region: Secondary | ICD-10-CM | POA: Diagnosis not present

## 2024-07-28 DIAGNOSIS — Z8719 Personal history of other diseases of the digestive system: Secondary | ICD-10-CM | POA: Diagnosis not present

## 2024-07-28 DIAGNOSIS — I10 Essential (primary) hypertension: Secondary | ICD-10-CM | POA: Diagnosis not present

## 2024-07-28 DIAGNOSIS — Z9889 Other specified postprocedural states: Secondary | ICD-10-CM | POA: Diagnosis not present
